# Patient Record
Sex: Female | Born: 1935 | Race: White | Hispanic: No | Marital: Single | Smoking: Never smoker
Health system: Southern US, Community
[De-identification: ages and names within clinical notes are randomized; demographics above are authoritative.]

## PROBLEM LIST (undated history)

## (undated) DIAGNOSIS — Z950 Presence of cardiac pacemaker: Secondary | ICD-10-CM

## (undated) DIAGNOSIS — N390 Urinary tract infection, site not specified: Secondary | ICD-10-CM

## (undated) DIAGNOSIS — M549 Dorsalgia, unspecified: Secondary | ICD-10-CM

## (undated) DIAGNOSIS — I499 Cardiac arrhythmia, unspecified: Secondary | ICD-10-CM

## (undated) DIAGNOSIS — M810 Age-related osteoporosis without current pathological fracture: Secondary | ICD-10-CM

## (undated) DIAGNOSIS — Z7901 Long term (current) use of anticoagulants: Secondary | ICD-10-CM

## (undated) DIAGNOSIS — R0602 Shortness of breath: Secondary | ICD-10-CM

## (undated) DIAGNOSIS — H9190 Unspecified hearing loss, unspecified ear: Secondary | ICD-10-CM

## (undated) DIAGNOSIS — K219 Gastro-esophageal reflux disease without esophagitis: Secondary | ICD-10-CM

## (undated) DIAGNOSIS — M1990 Unspecified osteoarthritis, unspecified site: Secondary | ICD-10-CM

## (undated) DIAGNOSIS — G8929 Other chronic pain: Secondary | ICD-10-CM

## (undated) DIAGNOSIS — J811 Chronic pulmonary edema: Secondary | ICD-10-CM

## (undated) DIAGNOSIS — E669 Obesity, unspecified: Secondary | ICD-10-CM

## (undated) DIAGNOSIS — I442 Atrioventricular block, complete: Secondary | ICD-10-CM

## (undated) DIAGNOSIS — R339 Retention of urine, unspecified: Secondary | ICD-10-CM

## (undated) DIAGNOSIS — R55 Syncope and collapse: Secondary | ICD-10-CM

## (undated) DIAGNOSIS — R001 Bradycardia, unspecified: Secondary | ICD-10-CM

## (undated) DIAGNOSIS — F329 Major depressive disorder, single episode, unspecified: Secondary | ICD-10-CM

## (undated) DIAGNOSIS — I071 Rheumatic tricuspid insufficiency: Secondary | ICD-10-CM

## (undated) DIAGNOSIS — R42 Dizziness and giddiness: Secondary | ICD-10-CM

## (undated) DIAGNOSIS — D509 Iron deficiency anemia, unspecified: Secondary | ICD-10-CM

## (undated) DIAGNOSIS — F419 Anxiety disorder, unspecified: Secondary | ICD-10-CM

## (undated) DIAGNOSIS — R1312 Dysphagia, oropharyngeal phase: Secondary | ICD-10-CM

## (undated) DIAGNOSIS — E538 Deficiency of other specified B group vitamins: Secondary | ICD-10-CM

## (undated) DIAGNOSIS — E876 Hypokalemia: Secondary | ICD-10-CM

## (undated) DIAGNOSIS — A419 Sepsis, unspecified organism: Secondary | ICD-10-CM

## (undated) DIAGNOSIS — L89159 Pressure ulcer of sacral region, unspecified stage: Secondary | ICD-10-CM

## (undated) DIAGNOSIS — I4892 Unspecified atrial flutter: Secondary | ICD-10-CM

## (undated) DIAGNOSIS — E871 Hypo-osmolality and hyponatremia: Secondary | ICD-10-CM

## (undated) DIAGNOSIS — F0390 Unspecified dementia without behavioral disturbance: Secondary | ICD-10-CM

## (undated) DIAGNOSIS — Z789 Other specified health status: Secondary | ICD-10-CM

## (undated) DIAGNOSIS — I10 Essential (primary) hypertension: Secondary | ICD-10-CM

## (undated) DIAGNOSIS — R488 Other symbolic dysfunctions: Secondary | ICD-10-CM

## (undated) DIAGNOSIS — F32A Depression, unspecified: Secondary | ICD-10-CM

## (undated) HISTORY — DX: Iron deficiency anemia, unspecified: D50.9

## (undated) HISTORY — DX: Long term (current) use of anticoagulants: Z79.01

## (undated) HISTORY — DX: Major depressive disorder, single episode, unspecified: F32.9

## (undated) HISTORY — DX: Retention of urine, unspecified: R33.9

## (undated) HISTORY — DX: Rheumatic tricuspid insufficiency: I07.1

## (undated) HISTORY — DX: Shortness of breath: R06.02

## (undated) HISTORY — DX: Atrioventricular block, complete: I44.2

## (undated) HISTORY — DX: Hypo-osmolality and hyponatremia: E87.1

## (undated) HISTORY — DX: Other specified health status: Z78.9

## (undated) HISTORY — DX: Age-related osteoporosis without current pathological fracture: M81.0

## (undated) HISTORY — PX: CHOLECYSTECTOMY: SHX55

## (undated) HISTORY — DX: Dorsalgia, unspecified: M54.9

## (undated) HISTORY — DX: Presence of cardiac pacemaker: Z95.0

## (undated) HISTORY — DX: Unspecified atrial flutter: I48.92

## (undated) HISTORY — DX: Pressure ulcer of sacral region, unspecified stage: L89.159

## (undated) HISTORY — PX: KNEE ARTHROSCOPY: SUR90

## (undated) HISTORY — DX: Dysphagia, oropharyngeal phase: R13.12

## (undated) HISTORY — DX: Hypokalemia: E87.6

## (undated) HISTORY — DX: Chronic pulmonary edema: J81.1

## (undated) HISTORY — DX: Sepsis, unspecified organism: A41.9

## (undated) HISTORY — PX: APPENDECTOMY: SHX54

## (undated) HISTORY — DX: Urinary tract infection, site not specified: N39.0

## (undated) HISTORY — DX: Other chronic pain: G89.29

## (undated) HISTORY — DX: Deficiency of other specified B group vitamins: E53.8

## (undated) HISTORY — DX: Hypercalcemia: E83.52

## (undated) HISTORY — DX: Other symbolic dysfunctions: R48.8

## (undated) HISTORY — DX: Gastro-esophageal reflux disease without esophagitis: K21.9

## (undated) HISTORY — DX: Unspecified hearing loss, unspecified ear: H91.90

---

## 2002-02-11 ENCOUNTER — Encounter: Payer: Self-pay | Admitting: Emergency Medicine

## 2002-02-11 ENCOUNTER — Emergency Department (HOSPITAL_COMMUNITY): Admission: EM | Admit: 2002-02-11 | Discharge: 2002-02-11 | Payer: Self-pay | Admitting: Emergency Medicine

## 2002-11-24 ENCOUNTER — Encounter: Payer: Self-pay | Admitting: Emergency Medicine

## 2002-11-24 ENCOUNTER — Emergency Department (HOSPITAL_COMMUNITY): Admission: EM | Admit: 2002-11-24 | Discharge: 2002-11-24 | Payer: Self-pay | Admitting: Emergency Medicine

## 2002-11-29 ENCOUNTER — Encounter: Payer: Self-pay | Admitting: Emergency Medicine

## 2002-11-29 ENCOUNTER — Inpatient Hospital Stay (HOSPITAL_COMMUNITY): Admission: EM | Admit: 2002-11-29 | Discharge: 2002-11-30 | Payer: Self-pay | Admitting: Emergency Medicine

## 2002-11-30 ENCOUNTER — Encounter (INDEPENDENT_AMBULATORY_CARE_PROVIDER_SITE_OTHER): Payer: Self-pay | Admitting: Cardiology

## 2003-08-12 ENCOUNTER — Emergency Department (HOSPITAL_COMMUNITY): Admission: EM | Admit: 2003-08-12 | Discharge: 2003-08-12 | Payer: Self-pay | Admitting: Emergency Medicine

## 2003-08-12 ENCOUNTER — Encounter: Payer: Self-pay | Admitting: Emergency Medicine

## 2003-08-23 ENCOUNTER — Encounter: Payer: Self-pay | Admitting: Emergency Medicine

## 2003-08-23 ENCOUNTER — Inpatient Hospital Stay (HOSPITAL_COMMUNITY): Admission: EM | Admit: 2003-08-23 | Discharge: 2003-08-24 | Payer: Self-pay | Admitting: Emergency Medicine

## 2003-08-29 ENCOUNTER — Encounter (INDEPENDENT_AMBULATORY_CARE_PROVIDER_SITE_OTHER): Payer: Self-pay | Admitting: Specialist

## 2003-08-29 ENCOUNTER — Inpatient Hospital Stay (HOSPITAL_COMMUNITY): Admission: RE | Admit: 2003-08-29 | Discharge: 2003-09-01 | Payer: Self-pay | Admitting: General Surgery

## 2003-08-29 ENCOUNTER — Encounter: Payer: Self-pay | Admitting: General Surgery

## 2003-08-30 ENCOUNTER — Encounter: Payer: Self-pay | Admitting: Gastroenterology

## 2003-11-25 ENCOUNTER — Inpatient Hospital Stay (HOSPITAL_COMMUNITY): Admission: EM | Admit: 2003-11-25 | Discharge: 2003-12-01 | Payer: Self-pay | Admitting: Emergency Medicine

## 2003-11-28 ENCOUNTER — Encounter (INDEPENDENT_AMBULATORY_CARE_PROVIDER_SITE_OTHER): Payer: Self-pay | Admitting: Specialist

## 2004-01-01 ENCOUNTER — Encounter: Admission: RE | Admit: 2004-01-01 | Discharge: 2004-03-05 | Payer: Self-pay | Admitting: Orthopedic Surgery

## 2004-05-02 ENCOUNTER — Inpatient Hospital Stay (HOSPITAL_COMMUNITY): Admission: EM | Admit: 2004-05-02 | Discharge: 2004-05-05 | Payer: Self-pay | Admitting: Emergency Medicine

## 2004-05-04 ENCOUNTER — Encounter (INDEPENDENT_AMBULATORY_CARE_PROVIDER_SITE_OTHER): Payer: Self-pay | Admitting: Cardiology

## 2008-01-21 ENCOUNTER — Inpatient Hospital Stay (HOSPITAL_COMMUNITY): Admission: EM | Admit: 2008-01-21 | Discharge: 2008-01-26 | Payer: Self-pay | Admitting: Emergency Medicine

## 2011-03-30 NOTE — H&P (Signed)
NAMEAVE, SCHARNHORST NO.:  [id number]   MEDICAL RECORD NO.:  [id number]          PATIENT TYPE:  EMS   LOCATION:  MAJO                         FACILITY:  [facility]   PHYSICIAN:  Lonia Blood, M.D.       DATE OF BIRTH:  January 05, 1928   DATE OF ADMISSION:  01/21/2008  DATE OF DISCHARGE:                              HISTORY & PHYSICAL   PRIMARY CARE PHYSICIAN:  Primary Care, Dr. Gabriel Earing.   CHIEF COMPLAINT:  Epigastric pain.   HISTORY OF PRESENT ILLNESS:  Mrs. Ungerer is a 77 year old woman who says  that for the past couple weeks she has been having intermittent  interscapular pain.  She also says that she thinks the pain is radiating  to her lower chest and maybe epigastric area.  The patient says that the  pain is constant, severe, about 4/10 and she got more concerned when she  was developing also shortness of breath.  The patient has had a cardiac  catheterization back in 2004 when she was found not to have any  significant coronary disease.   PAST MEDICAL HISTORY:  Hypertension, cholecystectomy, chronic anxiety,  osteoarthritis, gastroesophageal reflux disease.   HOME MEDICATIONS:  Diovan, Xanax, Maxzide, nifedipine and Nexium.  Ms.  Blakeney says that she actually does not take the Nexium because it causes  her to have diarrhea.   SOCIAL HISTORY:  The patient is married, lives with her husband.  She  does not smoke cigarettes, does not drink alcohol.   FAMILY HISTORY:  The patient's father died of cardiac disease.   ALLERGIES:  Aspirin.   REVIEW OF SYSTEMS:  Negative for nausea or vomiting.  Negative for  diarrhea.  Negative for coughing.  Negative for headaches.  Negative  focal weakness or numbness.  All other systems have been reviewed and  are negative.   PHYSICAL EXAMINATION:  VITAL SIGNS:  Temperature of 97.2, blood pressure  133/75, pulse 59, respiration 18, saturation 99% on room air.  GENERAL APPEARANCE:  Mildly obese woman in no acute distress,  sitting on  stretcher.  Alert and oriented to place, person and time.  Head  normocephalic, atraumatic.  Eyes: Pupils equal and round and react to  light and accommodation.  Patient wearing glasses.  Conjunctivae are  pink.  Sclera anicteric.  Throat clear.  NECK:  Supple.  No JVD.  CHEST: Clear to auscultation without wheeze, rhonchi or crackles.  HEART:  Regular rate and rhythm without murmurs, rubs or gallops.  ABDOMEN:  Obese and soft, nontender.  Bowel sounds are present.  LOWER EXTREMITIES: Without edema.  SKIN:  Warm, dry without any suspicious rashes.   LABORATORY VALUES ON ADMISSION:  Sodium is 119, potassium 3.5, chloride  85, BUN 14, glucose 107, creatinine is 1.6, myoglobin 143, CK-MB less  than one.  Troponin I less than 0.05, D-dimer is less than 0.22.  Chest  x-ray shows hiatal hernia.  No acute disease.  EKG reviewed by myself  shows a first-degree AV block and nonsignificant ST-T changes.   ASSESSMENT/PLAN:  1. A 77-year woman with lower chest  and epigastric pain persisted for      about a couple weeks.  My plan is to admit the patient to the      hospital to rule out an inferior myocardial infarction.  I am also      considering the possibility of gastritis, esophagitis, peptic ulcer      disease.  Ms. Cacioppo will be started on intravenous proton pump      inhibitor and a lipase will be checked too, to rule out      pancreatitis.  Three sets of cardiac enzymes will be checked every      8 hours.  The patient will be kept on telemetry.  2. Hyponatremia.  I do suspect this might be secondary      hydrochlorothiazide.  Serum osmolality, urine osmolality, TSH and      cortisol will be sent as well.  Intravenous fluids and      discontinuation of hydrochlorothiazide will be the way to go for      now.  Close monitoring of the patients BMP wil be done.      Lonia Blood, M.D.  Electronically Signed     SL/MEDQ  D:  01/21/2008  T:  01/22/2008  Job:  [id number]

## 2011-03-30 NOTE — Consult Note (Signed)
NAMESHALIN, LINDERS NO.:  [id number]   MEDICAL RECORD NO.:  [id number]          PATIENT TYPE:  INP   LOCATION:  4715                         FACILITY:  [facility]   PHYSICIAN:  John C. Madilyn Fireman, M.D.    DATE OF BIRTH:  Oct 19, 1928   DATE OF CONSULTATION:  01/24/2008  DATE OF DISCHARGE:                                 CONSULTATION   REQUESTING PHYSICIAN:  Wilson Singer, M.D.   We were asked to see Ms. Felmlee today in consultation by Dr. Karilyn Cota of  [facility].   HISTORY OF PRESENT ILLNESS:  This is a very pleasant 77 year old female  who has been a patient of Dr. Molly Maduro Buccini with a history of  cholecystectomy and questionable choledocholithiasis.  She reports:   1. Increasing dyspnea on exertion at the beach last week.  2. Development of severe back pain under her rib cage bilaterally      while in the car on the way home on the beach on Saturday or      Sunday.   The patient states this pain felt exactly like the pain she had when she  had an impacted gallbladder.  The patient denies any anorexia, fever,  emesis or change in bowel habits.  She reports that she occasionally has  heartburn.  Her primary care physician is Dr. Gabriel Earing.  The  patient also tells me she has never had a colonoscopy.   PAST MEDICAL HISTORY:  1. Hypertension.  2. Anxiety.  3. Osteoarthritis.  4. GERD.  5. She does have a small hiatal hernia.   SURGERIES:  1. A cholecystectomy on August 29, 2003 and an ERCP done by Dr.      Bernette Redbird on August 30, 2003, that was negative for common      bile duct stones.  2. She has had as a left knee arthroscopic a November 28, 2003.   CURRENT MEDICATIONS:  Diovan, Xanax, Maxzide and nifedipine.  She also  takes over-the-counter Zantac as PPIs give her diarrhea.   She has an allergy to ASPIRIN.   REVIEW OF SYSTEMS:  Per HPI.   SOCIAL HISTORY:  Negative for alcohol and tobacco.   FAMILY HISTORY:  Negative for pancreatic,  liver or colon cancer.  She  does have a niece who has had gallbladder disease.   PHYSICAL EXAM:  She is alert and oriented in no apparent distress, very  pleasant to speak with.  She is standing up brushing her dentures.  Temperature is 97.6, pulse 58, respirations are 20, blood pressure is  100/68.  CARDIOVASCULAR:  A regular rate and rhythm with slight systolic murmur.  LUNGS:  Clear to auscultation.  ABDOMEN:  Soft, nontender, nondistended, with good bowel sounds.   LABS:  Her BMET is significant for a sodium of 130, potassium 3.4, BUN  5, creatinine 0.88.  LFTs are normal.  Lipase is normal.  CBC is normal.  On March 10 an abdominal ultrasound was done that showed her CBD was  dilated at 12 mm with no common bile duct stones seen.  Her  CT of her  abdomen and pelvis is pending.   ASSESSMENT:  Dr. Dorena Cookey has seen and examined the patient, collected  a history, and his impression is that this is a very pleasant 79-year-  old female experiencing sharp back pain of uncertain etiology.  She also  does have a dilated common bile duct on ultrasound.  Given her normal  liver function tests and CBC and normal exam today, doubt any immediate  endoscopic procedures or endoscopic retrograde cholangiopancreatography  is necessary.  Will await CT results and follow with you.   Thank you very much for this consultation.      Stephani Police, PA    ______________________________  Everardo All Madilyn Fireman, M.D.    MLY/MEDQ  D:  01/24/2008  T:  01/25/2008  Job:  [id number]   cc:   Bernette Redbird, M.D.  Gabriel Earing, M.D.

## 2011-04-02 NOTE — Consult Note (Signed)
NAME:  Tonya Maddox, Tonya Maddox                           ACCOUNT NO.:  [id number]   MEDICAL RECORD NO.:  [id number]                   PATIENT TYPE:  INP   LOCATION:  0371                                 FACILITY:  [facility]   PHYSICIAN:  Meade Maw, M.D.                 DATE OF BIRTH:  1928/01/12   DATE OF CONSULTATION:  DATE OF DISCHARGE:                                   CONSULTATION   INDICATION FOR CONSULTATION:  Syncope.   Tonya Maddox is a very pleasant 77 year old female who was in her usual state  of health on May 02, 2004. She found herself on the dining room floor and  was unsure as to how she had gotten there. The last that she remembers was  that was in the kitchen, does remember going to the dining room, woke with  total unconsciousness. The event was unwitnessed. There was no trauma. Ms.  Maddox notes that she has had an approximately two week history of  palpitations and heart fluttering. Her Tenormin has recently been  discontinued secondary to bradyarrhythmias. She has had some mild shortness  of breath with exertion. She has had no prior syncope against. She has  underwent a cardiac evaluation by Dr. Viann Fish in January 2004. This  revealed normal coronaries. Coronary angiography was performed for further  evaluation of dyspnea.  Her ejection fraction was noted to be 70%.   PAST MEDICAL HISTORY:  1. Cholecystitis, status post laparoscopic cholecystectomy in October 2004.  2. Hypertension.  3. GERD.  4. Xanax.  5. Degenerative joint disease.   PAST SURGICAL HISTORY:  Significant for cholecystectomy as noted above.   MEDICATIONS:  As an outpatient included Maxzide 37.5/25, Procardia 30 mg  daily, Diovan 160 mg daily, Xanax 1 mg daily, Nexium daily.   CURRENT MEDICATIONS:  Protonix instead of Nexium, Avapro instead of Diovan,  continues on her Maxzide, Lovenox 40 mg subcu has been started daily, and  aspirin 325 mg daily has been added.   ALLERGIES:  She has no  known drug allergies.   SOCIAL HISTORY:  The patient lives alone. Long-term use of tobacco use.  No  alcohol use. She has never married.  Her brother lives in an upstairs  apartment.   FAMILY HISTORY:  Father had cardiac disease, but she is unsure of the exact  etiology.   PHYSICAL EXAMINATION:  GENERAL: On physical exam reveals an elderly female  in no acute distress. Orlene Erm is appropriate.  VITAL SIGNS: She is afebrile. Heart rate in the 60s to 70s, systolic blood  pressure 110/46, O2 saturation 97% on room air.  HEENT: Unremarkable. Good carotid upstrokes. No carotid bruits. Thyroid is  not palpable.  PULMONARY:  Breath sounds are equal and clear to auscultation. No crackles  noted.  CARDIOVASCULAR: Normal S1 and S2. Regular rate and rhythm with no murmurs,  rubs, or gallops noted. PMI is not palpable.  ABDOMEN: Soft and nontender.  EXTREMITIES: No edema.  SKIN: Warm and dry.  NEUROLOGIC: Nonfocal.   LABORATORY DATA:  White count 6.1, hematocrit 42, platelet count 201,000.  Sodium 136, potassium 4.9, chloride 99, glucose 107, BUN 16, creatinine 1.3.  Serial cardiac enzymes are normal. BNP was 46.  TSH has not been ordered.   Chest x-ray reveals no acute disease. ECG reveals a sinus rhythm, first  degree block, otherwise normal ECG. Telemetry reviewed reveals a sinus  rhythm with a rate in the 60s to 70s, lowest rate in the 40s during sleep.   IMPRESSION:  1. Syncope in a 77 year old female. Of note, the patient had no trauma at     the time of the syncopal event. There was no urinary or fecal     incontinence to suggest seizures. She awoke with complete recovery of her     mental status. This would suggest a cardiac etiology. Coronary     angiography as  noted above was normal. A 2-D echo has been ordered and     would obtain a TSH if this has not recently been performed. Will     discontinue her oxygen. Encourage the patient to ambulate in the hallway.     She may  eventually need a loop recorder to assist in the diagnosis. At     this point would decrease her aspirin to 81 mg daily while on Lovenox.  2. Hypertension. Blood pressure is currently well controlled.  3. Anxiety. She continues on her Xanax.  4. Gastroesophageal reflux. The patient is currently on Protonix.                                               Meade Maw, M.D.    HP/MEDQ  D:  05/03/2004  T:  05/03/2004  Job:  [id number]   cc:   Gabriel Earing, M.D.  [address]  [place]  [place] [address]  Fax: [phone]   W. Ashley Royalty., M.D.  [address]  [place]  [place] [address]  Fax: [phone]

## 2011-04-02 NOTE — Op Note (Signed)
NAME:  Tonya Maddox, Tonya Maddox                           ACCOUNT NO.:  [id number]   MEDICAL RECORD NO.:  [id number]                   PATIENT TYPE:  INP   LOCATION:  0449                                 FACILITY:  [facility]   PHYSICIAN:  Marlowe Kays, M.D.               DATE OF BIRTH:  Apr 12, 1928   DATE OF PROCEDURE:  11/28/2003  DATE OF DISCHARGE:                                 OPERATIVE REPORT   PREOPERATIVE DIAGNOSIS:  Osteochondral fragments with mechanical  derangement, left knee.   POSTOPERATIVE DIAGNOSIS:  Osteochondral fragments with mechanical  derangement, left knee.   OPERATION:  Left knee arthroscopy with:  1. Partial lateral meniscectomy.  2. Debridement of medial femoral condyle, lateral femoral condyle, and     patella.  3. Arthrotomy with removal of two large osteochondral fragments.   SURGEON:  Marlowe Kays, M.D.   ASSISTANT:  Nurse.   ANESTHESIA:  General.   PATHOLOGY AND JUSTIFICATION FOR PROCEDURE:  Several years ago she had an  episode where her knee locked up on her, and she had another episode several  days ago.  She was admitted to [facility], where x-ray demonstrated at  least one large osteochondral fragment in the suprapatellar area.  The  situation was thoroughly discussed with her.  She lives alone and was  concerned about possibly falling and fracturing her hip and elected to have  operative intervention at this time.  See operative description below for  additional details.   DESCRIPTION OF PROCEDURE:  Satisfactory general anesthesia, pneumatic  tourniquet.  The knee was esmarched out nonsterilely.  A thigh stabilizer.  The left leg was prepped with Duraprep, draped in a sterile field.  Superior  medial saline inflow first through an anterolateral portal.  The medial  compartment of the knee joint was evaluated.  She had a good bit of reactive  tissue present, including a worn medial femoral condyle, all of which I  debrided out.  Her medial  meniscus was frayed but not torn.  Looking up the  medial gutter and suprapatellar area she had a good bit of wear of the  patella, which I shaved, and a large amount of synovitis in the superior  joint, which I debrided out with a 4.2 shaver.  I did find several  osteophytes but did not find the loose fragments through this portal.  I  then reversed portal laterally.  She had a badly torn lateral meniscus,  which I shaved down until smooth, and it had one significant divot out of  the lateral femoral condyle, which I debrided down.  I then looked up in the  lateral gutter and suprapatellar area and found an osteophyte off the  lateral femoral condyle but one large osteochondral fragment that was, I  felt, too large to try and get out arthroscopically.  Accordingly I  terminated the arthroscopic portion of the procedure at this point.  I made  a superior lateral parapatellar incision of a few inches long and on  entering the joint found the one fragment but also found another fragment of  almost similar size, with one measuring 2.5 x 1.5 cm and the other 2.5 x 1.0  cm.  These were removed.  I then brought in the C-arm and found no  additional fragments on the C-arm projections and also with digital  palpation of the joint could not appreciate any more.  Accordingly, I felt  that we had accomplished our goal.  The three portals were closed with  interrupted 4-0 nylon.  The lateral retinaculum was closed with interrupted  0 Vicryl, as was the deep subcutaneous tissue and superficial subcutaneous  tissue.  Nylon 4-0 was placed in the skin.  I injected the knee joint with  20 mL of  0.5% Marcaine with adrenalin.  Betadine and Adaptic dry sterile dressing  were applied to her leg.  The tourniquet was released with a little over an  hour and a half of tourniquet time.  She tolerated the procedure well and  was taken to the recovery room in satisfactory condition with no known   complications.                                               Marlowe Kays, M.D.    JA/MEDQ  D:  11/28/2003  T:  11/28/2003  Job:  [id number]

## 2011-04-02 NOTE — Op Note (Signed)
NAME:  Tonya Maddox, Tonya Maddox                           ACCOUNT NO.:  [id number]   MEDICAL RECORD NO.:  [id number]                   PATIENT TYPE:  INP   LOCATION:  0349                                 FACILITY:  [facility]   PHYSICIAN:  Ollen Gross. Vernell Morgans, M.D.              DATE OF BIRTH:  October 22, 1928   DATE OF PROCEDURE:  08/29/2003  DATE OF DISCHARGE:  09/01/2003                                 OPERATIVE REPORT   PREOPERATIVE DIAGNOSIS:  Gallstones.   POSTOPERATIVE DIAGNOSIS:  Cholecystitis with cholelithiasis, and possible  retained common duct stone.   OPERATION/PROCEDURE:  Laparoscopic cholecystectomy with intraoperative  cholangiogram.   SURGEON:  Ollen Gross. Carolynne Edouard, M.D.   ASSISTANT:  Currie Paris, M.D.   ANESTHESIA:  General endotracheal anesthesia.   DESCRIPTION OF PROCEDURE:  After informed consent was obtained, the patient  was brought to the operating room and placed in the supine position on the  operating room table.  After adequate induction of general anesthesia, the  patient's abdomen was prepped with Betadine and draped in the usual sterile  manner.  The area below the umbilicus was infiltrated with 0.25% Marcaine.  A small incision was made with the 15-blade knife.  This incision was  carried down through the subcutaneous tissue bluntly with the Kelly clamp  and [employer] retractors until the linea alba was identified.  The linea  alba was incised with the 15-blade knife and each side was grasped with  Kocher clamps and elevated anteriorly.  The preperitoneal space was then  probed bluntly with a hemostat until the peritoneum was opened and access  was gained to the abdominal cavity.  A 0-Vicryl pursestring stitch was  placed in the fascia surrounding the opening. The Hasson cannula was placed  through the opening and anchored in place with the previously placed Vicryl  pursestring stitch.  The abdomen was then insufflated with carbon dioxide  without difficulty.  The  patient was placed in the head-up position and the  laparoscope was placed through the Hasson cannula and the right upper  quadrant was inspected.  The stoma of the gallbladder and liver were readily  identified.  The epigastric region was then infiltrated with 0.25% Marcaine.  A small incision was made with the 15-blade knife and a 10 mm port was  placed bluntly through this incision into the abdominal cavity under direct  vision.  Sites were then chosen laterally on the right side of the abdomen  with placement of a 5 mm ports.  Each of these areas was infiltrated with  0.25% Marcaine.  Small stab incisions were made with 15-blade knife and a 5  mm port was placed bluntly through these incisions into the abdominal cavity  under direct vision.  A blunt grasper was placed through the lateral-most 5  mm port and used to grasp the dome of the gallbladder and elevate it  anteriorly and  superiorly.  The gallbladder was rather tense and required  percutaneous aspiration.  Once this was done, it appeared the gallbladder  had some purulence in it.  The gallbladder was then elevated anteriorly and  superiorly. Another blunt grasper was placed in the other 5 mm port and used  to retract on the body and neck of the gallbladder.  A dissector was placed  through the epigastric port and using the electrocautery, the peritoneal  reflection at the gallbladder neck was opened.  Very careful blunt  dissection was then carried out in this area until the gallbladder  neck/cystic duct junction was readily identified.  The cystic duct appeared  to be very short and a lot of care of taken during this portion of the  dissection.  Once the good window had been created, a single clip was placed  on the gallbladder neck.  A small ductotomy was made.  A 14-gauge angiocath  was placed percutaneously through the anterior abdominal wall.  A Reddick  cholangiogram catheter was placed through the angiocath and flushed.   The  Reddick catheter was then placed within the cystic duct and anchored in  place with a clip.  A cholangiogram was obtained that suggested a small  filling defect consistent with a common bile duct stone that was freely  floating and mobile and nonobstructing.  The rest of the ducts were dilated  but emptied readily into the duodenum and the cystic duct was very short.  The anchoring clip and catheters were then removed from the patient.  Because of the shortness of the cystic duct and its delicacy, two clips were  placed proximally on the cystic duct and the duct was divided between the  two sets of clips.  Posterior to this the cystic artery are identified and  again dissected in a circumferential manner bluntly until a good window was  created.  Two clips were placed proximally and one distally on the artery  and the artery was divided between the two.  Next, the laparoscopic hook  cautery device was used to separate the gallbladder from the liver bed.  Prior to completely detaching the gallbladder from the liver bed, the liver  bed was inspected and several small bleeding points were coagulated with the  electrocautery.  The gallbladder was then detached the rest of the way from  the liver bed with the hook cautery device.  An endoscopic device was then  placed through the epigastric port and the gallbladder was then placed  through the epigastric and the gallbladder was placed within the bag and the  bag was sealed.  The abdomen was then irrigated with copious amounts of  saline until the effluent was clear.  The laparoscope was then moved to the  epigastric port and a gallbladder grasper was placed through the Hasson  cannula and used to grasp the opening of the bag.  The bag with the  gallbladder was then removed through the infraumbilical port without  difficulty.  The fascial defect was closed with the previously placed 0 Vicryl pursestring stitch as well as with another  interrupted 0 Vicryl  stitch.  The rest of the ports were removed under direct vision and were all  found to be hemostatic.  The gas was allowed to escape.  The skin incisions  were all closed with interrupted 4-0 Monocryl subcuticular stitches.  Benzoin and Steri-Strips and sterile dressings were applied.  The patient  tolerated the procedure well.  At the end of  the case all needle, sponge and  instrument counts were correct.  The patient was awakened and taken to the  recovery room in stable condition.   Gastroenterology would be consulted for further intervention, possible ERCP  to remove what was felt to be a common bile duct stone.                                                  Ollen Gross. Vernell Morgans, M.D.    PST/MEDQ  D:  09/01/2003  T:  09/01/2003  Job:  [id number]

## 2011-04-02 NOTE — Discharge Summary (Signed)
NAME:  Tonya Maddox, Tonya Maddox                           ACCOUNT NO.:  [id number]   MEDICAL RECORD NO.:  [id number]                   PATIENT TYPE:  INP   LOCATION:  0371                                 FACILITY:  [facility]   PHYSICIAN:  Hollice Espy, M.D.            DATE OF BIRTH:  1928/10/26   DATE OF ADMISSION:  05/02/2004  DATE OF DISCHARGE:  05/05/2004                                 DISCHARGE SUMMARY   PRIMARY CARE PHYSICIAN:  Gabriel Earing, M.D.   CARDIOLOGIST:  Georga Hacking, M.D.   CONSULTS:  Dr. Meade Maw, cardiology.   CHIEF COMPLAINT:  Syncope.   HISTORY OF PRESENT ILLNESS:  This is a 77 year old white female with a  history of hypertension, GERD, anxiety, who presented to [facility] ER  after having a period of a syncopal episode on May 02, 2004.  She has had  an approximately two-week history of palpitations and heart fluttering.  The  patient had been on Tenormin but this had been recently discontinued  secondary to bradycardic arrhythmias.  The patient was admitted to the  hospital.  Cardiac enzymes were checked and were found to be negative.  The  patient was put on telemetry.  A 2D echo and a cardiac consult were  requested.  The patient otherwise was stable.   HOSPITAL COURSE:  In regard to the patient's syncope, the patient had no  further events during her hospital stay.  In fact, she was able to walk  around freely in the hallways without event.  A 2D echo was checked, and the  patient was found to have normal heart function.  A 2D echo was checked on  May 04, 2004.  It was found to have normal left ventricular systolic  function and actually a slightly vigorous ejection fraction of 65-75%.  No  left ventricular regional wall abnormalities were noted.  She had an  increased relative contribution of atrial contraction and left ventricular  filling.  Some mild aortic regurg and mild mitral annular calcification.  In  addition, cardiology, Dr. Fraser Din, saw  the patient and felt the patient  otherwise was stable.  Following a normal echo, they recommended discharge  and a possible event monitor as an outpatient.  As part of her workup, TSH  was checked and found to be low at 0.258.  A free T4 was checked and found  to be on the higher end of normal at 1.66.  At this point, it was felt that  the patient likely had some subclinical hyperthyroidism, probably masked by  her previous Tenormin.  I have restarted the patient on her Tenormin at a  very low dose of 6.25 mg p.o. daily in the morning, considering that her  symptoms mostly occur during the day.   Otherwise, the patient is doing well.  She is being discharged.  She will  follow up with Dr. Donnie Aho in two weeks  as an outpatient and at that time be  fitted for an event monitor.  The rest of her medical issues, including  blood pressure, GERD, anxiety, and DJD were all stable medical issues.   Patient is being discharged to home.  Her disposition is improved.  Her diet  will be a low sodium diet.  Activity will be as tolerated.  She will follow up with her PCP, Dr.  Andi Devon, in the next 1-2 weeks.  She will continue all of her previous  medications.                                               Hollice Espy, M.D.    SKK/MEDQ  D:  05/05/2004  T:  05/06/2004  Job:  [id number]   cc:   Meade Maw, M.D.  [address]  [place]  [place] [address]  Fax: [phone]   Gabriel Earing, M.D.  [address]  [place]  [place] [address]  Fax: [phone]   W. Ashley Royalty., M.D.  [address]  [place]  [place] [address]  Fax: [phone]

## 2011-04-02 NOTE — H&P (Signed)
Tonya Maddox, MCKERCHER NO.:  [id number]   MEDICAL RECORD NO.:  [id number]                   PATIENT TYPE:  INP   LOCATION:  6529                                 FACILITY:  [facility]   PHYSICIAN:  W. Ashley Royalty., M.D.         DATE OF BIRTH:  14-Apr-1928   DATE OF ADMISSION:  11/29/2002  DATE OF DISCHARGE:                                HISTORY & PHYSICAL   REASON FOR ADMISSION:  This 77 year old female is admitted to rule out a  myocardial infarction and evaluate for possible unstable angina.   HISTORY OF PRESENT ILLNESS:  The patient has a longstanding history of  essential hypertension.  She has been taken care of by Dr. Andi Devon and was  seen in the office last week with increasing dyspnea and bradycardia.  She  was scheduled to have an echocardiogram.  Approximately five days ago she  developed worsening shortness of breath and chest discomfort and was seen at  the [facility] Room and was sent home.  She returned last evening with  increasing shortness of breath accompanied with chest pain described as a  sharp midsternal chest pain with some pressure characteristics lasting  around one minute.  The pain occurred and then resolved but recurred last  evening and she was brought by ambulance to the emergency room again and  admitted to the rule out unit.  She is currently not having any chest pain  at the present time.  She has had progressive dyspnea on exertion over the  past month also.   PAST MEDICAL HISTORY:  Remarkable for hypertension which is longstanding.  She has history of morbid obesity and no history of diabetes.   PAST SURGICAL HISTORY:  Appendectomy.   ALLERGIES:  None.   CURRENT MEDICATIONS:  Atenolol 100 mg daily, Maxzide daily, Procardia 90 mg  daily (recently changed from Norvasc), meclizine 25 mg as needed, potassium,  alprazolam for anxiety.   FAMILY HISTORY:  She had a sister die of rheumatic heart disease and  atrial  fibrillation.  There is no premature family history of heart disease.   SOCIAL HISTORY:  She has never been married.  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  She has never smoked or used alcohol  to excess. She currently lives alone.   REVIEW OF SYSTEMS:  She has had some intermittent dizziness described as  vertigo for which she has taken meclizine.  She has also had bradycardia  evaluated recently.  She wears glasses.  She has no other eye, ear, nose or  throat problems.  She has had progressive dyspnea on exertion.  She denies  PND or orthopnea.  She does not have exertional chest pain but gets little  in the way of exercise.  GI:  She denies diarrhea, constipation,  hematochezia, or melena.  GU:  Unremarkable.  She has moderate arthritis  involving her hands and  her knees.  She has some mild peripheral edema and  also  has some chronic venous insufficiency in the past.  No history of  stroke.  Other than noted above, the remainder of the review of systems  unremarkable.   PHYSICAL EXAMINATION:  GENERAL:  She is a pleasant, elderly woman who is  obese and in no acute distress.  VITAL SIGNS:  Blood pressure currently 160/80, pulse 50.  SKIN:  Warm and dry with some changes of chronic venous insufficiency over  the lower extremities.  HEENT:  EOMI.  PERRLA.  Sclerae clear.  Fundi not examined.  Pharynx  negative.  NECK:  Supple without masses.  No JVD, thyromegaly or bruits.  LUNGS:  Clear to A&P.  CARDIAC:  Normal S1 and S2.  No S3, S4 or murmur.  BREASTS:  Not examined.  ABDOMEN:  Obese, soft and nontender.  No aneurysm.  Pulmonary pulses are  deep and 1-2+.  Dorsalis pedis are 2+.  Posterior tibial pulses are  difficult to feel.  No edema noted.  EKG:  Sinus bradycardia with nonspecific ST abnormality.   LABORATORY DATA:  Hemoglobin 14.3, hematocrit 42.5.  Chemistry panel shows a  sodium of 138, potassium 3.7, chloride 103, CO2 31, BUN 20,  creatinine 1.2,  glucose 1.6.  Cardiac enzymes showed negative CPK and MB.   IMPRESSION:  1. Progressive dyspnea accompanied by chest pain which may be a typical     angina and unstable angina.  2. Hypertensive heart disease.  Blood pressure not well controlled.  3. Obesity.  4. History of chronic venous insufficiency.  5. Anxiety.  6. History of vertigo.   RECOMMENDATIONS:  The patient is not having any chest pain at this time but  in view of the pain at rest and worsening dyspnea, I have elected to proceed  directly to cardiac catheterization to exclude significant coronary artery  disease as the cause.  The procedure was discussed with her fully including  risk of myocardial infarction, death, stroke, bleeding, arrhythmia, dye  allergy, or renal insufficiency and she is willing to proceed.  Possible  angioplasty and stenting also discussed with her.                                               Darden Palmer., M.D.    WST/MEDQ  D:  11/29/2002  T:  11/29/2002  Job:  [id number]   cc:   Gabriel Earing, M.D.  [address]  [place]  [place] [address]  Fax: [phone]

## 2011-04-02 NOTE — H&P (Signed)
NAME:  Tonya Maddox, Tonya Maddox                           ACCOUNT NO.:  [id number]   MEDICAL RECORD NO.:  [id number]                   PATIENT TYPE:  INP   LOCATION:  0102                                 FACILITY:  [facility]   PHYSICIAN:  Blanch Media, M.D.             DATE OF BIRTH:  Aug 11, 1928   DATE OF ADMISSION:  05/02/2004  DATE OF DISCHARGE:                                HISTORY & PHYSICAL   [name] is a 77 year old white female who is followed by Dr. Harvest Dark at  [facility].  Patient was in her usual state of health, and this morning woke  up and placed her breakfast on the dining room table.  She returned to the  kitchen, but she cannot remember why.  She was feeling completely normal.  The next thing she remembers is coming to on the dining room floor.  Other  than having some palpitations and dyspnea after regaining consciousness, she  had no other symptoms and was feeling in her next several hours.  She does  relate that for the past two days, she has been having fluttering, which she  describes as an increased feeling of her heartbeat and having it feel fast.  These are intermittent for the past two days and occasionally occur with  shortness of breath.  They do not occur with any pain or any other symptoms.  She has had some palpitations in the past, but she says not this strong and  not this fast.  She has also been noticing some shortness of breath, mostly  dyspnea on exertion.  She has been evaluated for dyspnea on exertion in the  past by Dr. Donnie Aho without any cardiac cause.  The patient has had no  syncope in the past.  There is no sudden death unexplained in her family.  When asked, the patient states that she does have some pain with a deep  breath.   PAST MEDICAL HISTORY:  1. Dyspnea.     A. Evaluated by Dr. Donnie Aho with a cardiac catheterization in January,        2004 that showed no coronary artery disease and an ejection fraction        of 70%.     B. Patient has had  a history of bradycardia.  Her Tenormin was recently        DC'd.  2. Cholecystitis and cholelithiasis.     A. On August 30, 2003, status post laparoscopic cholecystectomy.     B. On August 30, 2003, ERCP was negative for common bile duct stone.  3. Hypertension.  4. GERD.  5. Anxiety.     A. Patient uses Xanax daily.  6. Degenerative joint disease.     A. Status post left knee arthroscopy on November 28, 2003.   MEDICATIONS:  1. Maxzide 37.5/25 q.d.  2. Procardia 30 q.d.  3. Diovan 160 q.d.  4. Xanax 1 mg q.d.  5. Nexium q.d.   ALLERGIES:  Patient has no known drug allergies.   SOCIAL HISTORY:  Patient lives alone.  She is a lifelong cigarette and  nondrinker.   FAMILY HISTORY:  Her father has heart disease, but she does not know the  particulars.  There is no personal or family history of clotting disorders.  There is no family history of sudden unexplained death.  A relative does  have congestive heart failure.   PHYSICAL EXAMINATION:  VITAL SIGNS:  GENERAL:  Patient is in no acute distress.  She contributes fully to the  history.  NECK:  No carotid bruits.  No lymphadenopathy.  LUNGS:  Clear to auscultation bilaterally with good air movement.  No  wheezes.  No crackles.  HEART:  Regular rate and rhythm with an S3.  No murmurs appreciated.  ABDOMEN:  Positive bowel sounds.  Soft, nontender, nondistended.  EXTREMITIES:  No edema.  Warm.  SKIN:  No rashes.  NEUROLOGIC:  Strength grossly 5/5 in the upper extremities.   LABS:  Sodium 135, potassium 4.8, chloride 98, CO2 27, glucose 113, BUN 16,  creatinine 1.3, LFTs normal.  White blood cell count 6.1, hemoglobin 14.6,  platelet count 201.   CHEST X-RAY:  Increased volume but no pulmonary edema.  No infiltrate.   EKG:  Sinus rhythm.  There is an inverted T wave in lead 3 but otherwise no  ischemic changes.   ASSESSMENT/PLAN:  1. Syncope:  Her history would indicate that this is a cardiac etiology for     syncope.   She has had some history of bradycardia, and her beta blocker     was held secondary to this.  She had a catheterization a year and a half     ago that showed no coronary artery disease and a normal ejection     fraction.  I think an acute myocardial infarction is highly unlikely, but     we will cycle cardiac enzymes.  I think an arrhythmia is the most likely     etiology.  We will monitor her on telemetry.  We will hold her Procardia     for now and watch her heart rate.  We will continue her Maxzide and her     ARB.  As Dr. Donnie Aho has seen her in the past, we will notify his group     that see him in the hospital, as she will likely need further     investigations for her most likely arrhythmia as the etiology of her     syncope.  We will check a 2D echocardiogram, as she has not had one since     2004.  We will also check a D-dimer, and if positive, will proceed to a     CT scan to rule out a pulmonary embolus.  2. Hypertension:  Will continue her Maxzide and her Diovan but hold her     Procardia.  Monitor her blood pressure while she is in the hospital.  3. Gastroesophageal reflux disease:  Patient uses Nexium at home but will     use Protonix here in the hospital.  4. Anxiety:  Will continue her Xanax q.d.  5. Degenerative joint disease:  Will use Tylenol for pain control.  6. Prophylaxis:  Will start Lovenox for deep venous thrombosis prophylaxis.  Blanch Media, M.D.    EB/MEDQ  D:  05/02/2004  T:  05/02/2004  Job:  [id number]

## 2011-04-02 NOTE — Discharge Summary (Signed)
NAMECARTHA, ROTERT                 ACCOUNT NO.:  [id number]   MEDICAL RECORD NO.:  [id number]          PATIENT TYPE:  INP   LOCATION:  3017                         FACILITY:  [facility]   PHYSICIAN:  Wilson Singer, M.D.DATE OF BIRTH:  1928/03/08   DATE OF ADMISSION:  01/21/2008  DATE OF DISCHARGE:  01/26/2008                               DISCHARGE SUMMARY   FINAL DISCHARGE DIAGNOSES:  1. Epigastric abdominal pain secondary to passed gallstone most      likely.  2. Hypertension.  3. Chronic anxiety.  4. Gastroesophageal reflux disease.   MEDICATIONS ON DISCHARGE:  1. Xanax 1 mg daily.  2. Norvasc 10 mg daily.  3. Protonix 40 mg daily   CONDITION ON DISCHARGE:  Stable.   HISTORY:  This 77 year old lady was admitted with epigastric abdominal  pain.  Please see initial history and physical examination done by Dr.  Lonia Blood.   HOSPITAL PROGRESS:  The patient was investigated for abdominal pain and  serial cardiac enzymes were negative.  She underwent ultrasound of the  abdomen which showed:  By palpate common bile duct dilatation at 12 mm.  She then underwent a CT scan of the abdomen at which point there were no  masses or stone seen and the common bile duct was not dilated so the  working diagnosis appears to be passage of a stone and resolution of her  symptoms.  By this time, her symptoms had improved and on the day of  discharge, she was afebrile and vital signs were stable.  I suspect she  passed a common bile duct stone.   FURTHER DISPOSITION:  She will follow up with her primary care  physician.      Wilson Singer, M.D.  Electronically Signed     NCG/MEDQ  D:  03/25/2008  T:  03/26/2008  Job:  [id number]

## 2011-04-02 NOTE — Discharge Summary (Signed)
NAME:  Tonya Maddox, Tonya Maddox                           ACCOUNT NO.:  [id number]   MEDICAL RECORD NO.:  [id number]                   PATIENT TYPE:  INP   LOCATION:  0349                                 [facility]ACILITY:  [facility]   PHYSICIAN:  Ollen Gross. Vernell Morgans, M.D.              DATE OF BIRTH:  05-12-1928   DATE OF ADMISSION:  08/29/2003  DATE OF DISCHARGE:  09/01/2003                                 DISCHARGE SUMMARY   HISTORY OF PRESENT ILLNESS:  [name] is a 77 year old white female who  was admitted with cholecystitis and cholelithiasis.   HOSPITAL COURSE:  She was taken to the operating room and underwent a  laparoscopic cholecystectomy with intraoperative cholangiogram.  The  intraoperative cholangiogram did show a retained common bile duct stone that  was unable to be retrieved during the laparoscopic portion of the procedure.  The procedure was then completed, and postoperatively gastroenterology was  consulted for possible ERCP. She tolerated the ERCP well, and over the  ensuing two days, her diet was advanced.  A JP drainage was left after the  laparoscopic procedure, which was then removed on September 01, 2003, and she  was ready for discharge home.   CONDITION AT THE TIME OF DISCHARGE:  Stable.   FINAL DIAGNOSIS:  Cholecystitis and cholelithiasis with retained common duct  stone.   ACTIVITY:  No heavy lifting.   DIET:  As tolerated.   FOLLOW UP:  With Dr. Carolynne Edouard in the next two weeks.   DISPOSITION:  She is discharged home.                                               Ollen Gross. Vernell Morgans, M.D.    PST/MEDQ  D:  10/16/2003  T:  10/16/2003  Job:  [id number]

## 2011-04-02 NOTE — H&P (Signed)
NAME:  Tonya Maddox, Tonya Maddox                           ACCOUNT NO.:  [id number]   MEDICAL RECORD NO.:  [id number]                   PATIENT TYPE:  INP   LOCATION:  0372                                 FACILITY:  [facility]   PHYSICIAN:  Francisca December, M.D.               DATE OF BIRTH:  Mar 16, 1928   DATE OF ADMISSION:  08/23/2003  DATE OF DISCHARGE:                                HISTORY & PHYSICAL   REASON FOR ADMISSION:  Chest pain.   HISTORY OF PRESENT ILLNESS:  [name] is a 77 year old woman who was  at the preop center having ECG and blood work done for planned  cholecystectomy next Thursday. This would be October 15. She was told her  ECG was abnormal and apparently felt to be changed from previous tracings  and a suggestion was made that she see Dr. Donnie Aho at her earliest  convenience. She went home and began to develop anterior substernal chest  pain that radiated across her chest but was more centered on the right side  and then radiated underneath the right scapula. It was associated with some  dyspnea and nausea. It persisted approximately an hour and she presented to  [facility] Emergency Room at around 18:50. The pain resolved spontaneously  shortly thereafter. She does not have a cardiac history but has had chest  pain in the past. In fact, Dr. Donnie Aho performed cardiac catheterization in  January of 2004 revealing no significant coronary disease. Her usual  gallbladder attacks have been right upper quadrant, right lower chest aching  pain that radiated to the scapula. She has been administered Vicodin in the  past for this with some relief.   This morning at the time of my evaluation, she has no discomfort in her  chest and generally feels well. She does note she has not had much p.o.  intake for the past two days due to her gallbladder problems.   PAST MEDICAL HISTORY:  1. Hypertension.  2. GERD.  3. Anxiety and depression.  4. Cholelithiasis.   PAST SURGICAL  HISTORY:  Appendectomy.   CURRENT MEDICATIONS:  1. Procardia XL 30 mg p.o. daily.  2. Atenolol 50 mg p.o. daily.  3. Nexium 40 mg p.o. daily.  4. Diovan 160 mg p.o. daily.  5. Maxzide 25/37.5 one p.o. daily.  6. Vicodin 1-2 p.o. q. 4-6h. p.r.n.   ALLERGIES:  None known.   FAMILY HISTORY:  Not significant for early coronary disease.   SOCIAL HISTORY:  There is no use of alcohol or tobacco. She lives alone with  family in the area.   REVIEW OF SYMPTOMS:  Noncontributory or negative.   PHYSICAL EXAMINATION:  VITAL SIGNS:  Blood pressure is 174/90, pulse 56 and  regular, respiratory rate 18, temperature 97.7. O2 saturation on room air  98%.  GENERAL:  This is a mildly obese elderly 77 year old woman who is alert,  pleasant, cooperative, no distress.  HEENT:  Unremarkable. Head is atraumatic and normocephalic. Pupils equal  round and reactive to light and accommodation. Extraocular movements intact.  Sclerae are anicteric. Oral mucosa is pink and moist. Tongue is not coated.  NECK:  The neck is supple without thyromegaly or masses. The carotid  upstrokes are normal. There is no bruit, there is no jugular venous  distention.  CHEST:  Clear with adequate excursion. Normal vesicular breath sounds are  heard throughout, the precordium is quiet. Normal S1 and S2 is heard. No S3,  S4, murmur, click or rub noted.  ABDOMEN:  Soft and slightly tender in the right upper quadrant. Bowel sounds  are present in all quadrants. No hepatosplenomegaly. No midline pulsatile  mass. External genitalia is atrophic.  RECTAL:  Not performed.  EXTREMITIES:  Show full range of motion, no edema and intact distal pulses.  NEUROLOGIC:  Cranial nerves II-XII are intact. Motor and sensory grossly  intact. Gait not tested.  SKIN:  Warm, dry and clear.   ACCESSORY CLINICAL DATA:  Admission hemogram is within normal limits.  Electrolytes are normal with the exception of a sodium of 130, BUN of 61 and   creatinine of 2.5. Glucose is 102. PT and PTT are normal. Liver associated  enzymes are normal. Troponin is negative x2. Initial CK was 45 with an MB of  2.2.   Electrocardiogram shows normal sinus rhythm, normal EKG.   Chest x-ray as noted by the ER physician is without active disease.   IMPRESSION:  1. Lower substernal and across the chest aching pain that radiates to the     right posterior scapula. This is entirely consistent with an attack of     cholelithiasis. There is no evidence to suggest acute myocardial     infarction or ischemia.  2. She has significant renal insufficiency with a creatinine of 2.5. This     may have been exacerbated by poor p.o. intake in the recent days.  3. Hypertension.  4. History of GERD.  5. History of anxiety and depression currently not treated apparently.    PLAN:  At this point, I think IV hydration would be appropriate and perhaps  a recheck of her BUN and creatinine. A myocardial infarction has been ruled  out though and she had normal coronaries defined by coronary angiography  nine months ago. Will proceed with discharge today. She should see Dr.  Donnie Aho on Monday or Tuesday and still plan to proceed with cholecystectomy  on Thursday, October 15.                                               Francisca December, M.D.    JHE/MEDQ  D:  08/24/2003  T:  08/24/2003  Job:  [id number]   cc:   Darden Palmer., M.D.  [address]  [place]  [place] [address]  Fax: [phone]   Ollen Gross. Vernell Morgans, M.D.  [address]  [place]  [place] [address]  Fax: [phone]

## 2011-04-02 NOTE — Discharge Summary (Signed)
Tonya Maddox, Tonya Maddox NO.:  [id number]   MEDICAL RECORD NO.:  [id number]                   PATIENT TYPE:  INP   LOCATION:  3742                                 FACILITY:  [facility]   PHYSICIAN:  Darden Palmer., M.D.         DATE OF BIRTH:  05-Feb-1928   DATE OF ADMISSION:  11/29/2002  DATE OF DISCHARGE:  11/30/2002                                 DISCHARGE SUMMARY   FINAL DIAGNOSES:  1. Chest pain of uncertain etiology, myocardial infarction ruled out.  2. Dyspnea likely due to morbid obesity and physical deconditioning.  3. Morbid obesity.   PROCEDURES:  1. Cardiac catheterization.  2. Echocardiogram.   HISTORY OF PRESENT ILLNESS:  This 77 year old female has longstanding  essential hypertension.  She has had dyspnea and bradycardia and was  scheduled for an echocardiogram.  Five days prior to admission, she  developed worsening dyspnea and chest pain.  She was seen at the emergency  room and sent home.  She returned the evening prior to admission with  increasing dyspnea with chest pain described as a sharp midsternal pain  lasting around one minute.  She was brought by ambulance to the ER again and  admitted to the rule out MI unit.  Please see the previously dictated  history and physical for the remainder of the details.   LABORATORY DATA:  Laboratory data on admission showed a hemoglobin of 14.3  and hematocrit 42.5.  The chemistry panel was within normal limits.  CPKs  and MBs were normal.  The TSH was 0.46.  The EKG showed sinus bradycardia  and no acute abnormality.  The echocardiogram showed normal left ventricular  function and normal EF.  There was decreased LV compliance.  There was mild  aortic and mitral regurgitation.  The left atrium was mildly dilated.   HOSPITAL COURSE:  She was taken to the cardiac catheterization lab and had a  catheterization done showing a normal left ventricular function.  There was  no significant  coronary artery disease noted.  An echocardiogram was done  the next day and she was discharged in improved condition.  Atenolol was  reduced and Diovan was added to her regimen.   DISPOSITION:  She was discharged in improved condition.   DISCHARGE MEDICATIONS:  1. Diovan 160 mg daily.  2. Maxzide 25 mg daily.  3. Atenolol 50 mg daily.  4. Procardia XL 60 mg daily.    ACTIVITY:  She is to walk daily and is to call if there are problems.  It is  likely thought that the dyspnea is due to a combination of severe morbid  obesity and physical deconditioning, as well as diastolic dysfunction.   FOLLOW-UP:  She is to be seen for an appointment in one week and is to call  if there are problems.  Darden Palmer., M.D.    WST/MEDQ  D:  12/19/2002  T:  12/19/2002  Job:  [id number]   cc:   Gabriel Earing, M.D.  [address]  [place]  [place] [address]  Fax: [phone]

## 2011-04-02 NOTE — Discharge Summary (Signed)
NAME:  Tonya Maddox, POTENZA                           ACCOUNT NO.:  [id number]   MEDICAL RECORD NO.:  [id number]                   PATIENT TYPE:  INP   LOCATION:  0449                                 FACILITY:  [facility]   PHYSICIAN:  Marlowe Kays, M.D.               DATE OF BIRTH:  02-15-1928   DATE OF ADMISSION:  11/25/2003  DATE OF DISCHARGE:  12/01/2003                                 DISCHARGE SUMMARY   ADMISSION DIAGNOSES:  1. Hypertension.  2. Morbid obesity.  3. Gastroesophageal reflux disease.  4. Anxiety.  5. Osteochondral fragments with mechanical derangement of the left knee.   DISCHARGE DIAGNOSES:  1. Hypertension.  2. Morbid obesity.  3. Gastroesophageal reflux disease.  4. Anxiety.  5. Osteochondral fragments with mechanical derangement of the left knee.   OPERATION:  On November 28, 2003, the patient underwent a partial lateral  meniscectomy, debridement of medial femoral condyle, lateral femoral  condyle, and patella, arthrotomy with removal of two large osteochondral  fragments.  Operation #1 and #2 was done under arthroscopic direction.  Surgeries were done by Dr. Marlowe Kays.   BRIEF HISTORY:  This 77 year old white female was admitted by Dr. Candyce Churn on November 25, 2003.  She gives a history of sudden onset of  severe left knee pain on November 25, 2003.  She was twisting her leg going  to the bathroom.  Previously had derangement in the knee at the quadriceps  in 2003.  The patient presented to the emergency room.  She was unable to  walk on the lower extremity, could not ambulate or get about at all.  Dr.  Kevan Ny admitted the patient for pain control and probable surgical  correction.  We were asked to see the patient on consult on admission.  There had been an interruption of the quadriceps mechanism, however, her  main problem was intra-articular.  X-rays of the left knee showed small  intra-articular osteophyte loose bodies with degenerative  joint disease,  narrowing of the joint space, and osteophytic spurring.  Due to the findings  and the fact that the patient could not ambulate, she was scheduled for the  above surgery.  After the surgery, the patient was markedly relieved of her  discomfort.  She was able to ambulate and weight bear to the left lower  extremity.  She did well with physical therapy, pain 2/10, the left knee  showed no erythema or skin breakdown.  The third postoperative day it was  felt the patient could be maintained in her home environment, so  arrangements were made to discharge.  The patient was transferred to our  service prior to discharge after surgery.   LABORATORY DATA:  CBC with differential with a mild normocytic normochromic  anemia with RBC at 3.73, hematocrit 35.5.  Blood chemistries were  essentially normal.  Glucose was elevated at 118, BUN was 31.  Urinalysis  negative for urinary tract infection.  Electrocardiogram showed sinus  bradycardia with first degree AV block.  No chest x-ray seen on this chart.   CONDITION ON DISCHARGE:  Improved and stable.   PLAN:  The patient is discharged to her home.  She will continue  weightbearing as tolerated on the left lower extremity.  The patient was  comfortable and confident in ambulation at discharge.  Wounds were inspected  on the day prior to discharge, and wounds were all dry, neurovascularly  intact to the left lower extremity, and the patient could straight leg  raise.  She is to wear a knee immobilizer for comfort at home.  She is to  continue with her home medications and diet.  Use Vicodin for severe pain,  Darvocet for mild pain, Robaxin as a muscle relaxant.  She is to return to  Dr. Leim Fabry clinic at which time she will see his P.A., Cherly Beach.  She is to follow up with Dr. __________at [facility] on [facility] as needed.     Dooley L. Cherlynn June.                 Marlowe Kays, M.D.     DLU/MEDQ  D:  12/17/2003  T:  12/17/2003  Job:  [id number]   cc:   [facility]   Candyce Churn, M.D.  [address] [place]  [place] [address]  Fax: [phone]

## 2011-04-02 NOTE — Consult Note (Signed)
NAME:  Tonya Maddox, Tonya Maddox                           ACCOUNT NO.:  [id number]   MEDICAL RECORD NO.:  [id number]                   PATIENT TYPE:  OBV   LOCATION:  0349                                 FACILITY:  [facility]   PHYSICIAN:  Bernette Redbird, M.D.                DATE OF BIRTH:  01/01/1928   DATE OF CONSULTATION:  08/29/2003  DATE OF DISCHARGE:                                   CONSULTATION   HISTORY OF PRESENT ILLNESS:  Dr. Carolynne Edouard asked me to see this 77 year old  female because of apparent choledocholithiasis.   Ms. Hink underwent cholecystectomy this morning after having been admitted  to the hospital several days ago with chest pain where she ruled out for an  MI and was thus subsequently felt to have symptomatic cholelithiasis.   During her surgery today, the patient had a small cystic duct, but an  intraoperative cholangiogram was able to be performed, and this showed a  possible stone in the common duct.  I have reviewed the film, and agree that  there is a filling defect in the region of the cystic duct take-off,  although it is not seen on subsequent films, and thus may be artifactual.  The duct was somewhat generous in size, probably about 8 or 9 mm, and there  was good flow of diet into the duodenum, also some retrograde filling of the  pancreatic duct in the region of the head of the pancreas.   Note that the patient's preoperative liver chemistries were normal.   At this time, the patient is quite comfortable.  There is a little bit of  residual abdominal pain, presumably due to her incisions.   PAST MEDICAL HISTORY:  No known allergies.   OUTPATIENT MEDICATIONS:  1. Nexium.  2. Atenolol.  3. Xanax.  4. Maxzide.  5. Diovan.  6. Procardia XL.   OPERATIONS:  Remote appendectomy, and the above-mentioned laparoscopic  cholecystectomy earlier today.   MEDICAL ILLNESSES:  1. Hypertension.  2. No known cardiopulmonary disease (in fact, had a negative cardiac  catheterization within the past year by Dr. Viann Fish).  3. No diabetes.  4. There is also a history of anxiety and depression.   HABITS:  Nonsmoker, nondrinker.   FAMILY HISTORY:  Colon cancer in her brother when he was in his 75's.   SOCIAL HISTORY:  The patient lives alone.  She is accompanied this evening  by her sister and her niece.   REVIEW OF SYSTEMS:  The patient previously had reflux-type symptoms which  have been well controlled on Nexium, which has, however, led to some degree  of diarrhea.  No severe diarrhea, no rectal bleeding, no trouble swallowing,  loss of appetite, involuntary weight loss.   PHYSICAL EXAMINATION:  GENERAL:  Ms. Spitler is a healthy-appearing, somewhat  overweight female, in no evident distress, appearing neither anxious nor  depressed.  The patient is  anicteric and without evident pallor.  CHEST:  Clear.  HEART:  Normal.  ABDOMEN:  Soft, although slightly tender over her incision, especially on  the right side.   LABORATORY DATA:  White count 6100, hemoglobin 13.3, platelets 233,000.  Chemistry panel normal, except for a glucose of 102.  BUN 61, creatinine 2.5  (the latter of which were repeated since the above labs were obtained about  a week ago, and a current BUN is 21 and creatinine 2.0).  Amylase and lipase  were not checked.   Total bilirubin was 0.8, alkaline phosphatase 74, SGOT 22, SGPT 17, as of  August 23, 2003.   Intraoperative cholangiogram - I have reviewed the films, and my impression  is as above.   IMPRESSION:  1. Possible choledocholithiasis with abnormal intraoperative cholangiogram.  2. Status post cholecystectomy with documented cholecystitis and     cholelithiasis.  3. Generously-sized common bile duct on intraoperative cholangiogram and     preoperative ultrasound.  4. Chronic renal insufficiency.  5. Hypertension.   DISCUSSION AND RECOMMENDATIONS:  I think that there is a moderate  possibility of a common  duct stone being present.  With the normal liver  chemistries, and the equivocal cholangiogram, it would ordinarily be my  inclination to do an MRI cholangiogram first, and then the ERCP only if the  MRI scan was positive for a stone.  However, this patient indicates that she  was previously given an MRI scan and was not able to tolerate it due to  claustrophobia, even with sedation.  Thus, I do not think that she is  realistically a candidate for MRI cholangiography.  The open MRI's, per  discussion with the radiologist tonight, are not of sufficient strength to  permit an MRCP.   Thus, I reviewed with the patient, as well as her niece, Rivka Barbara, and her  sister, Santina Evans, who were at the bedside tonight, all options including  watchful waiting, ERCP with possible sphincterotomy and stone extraction,  and MRI cholangiography, along with the pro's and con's of each course of  action.  The patient is agreeable to whatever I feel is most appropriate.  I  feel, given the anticipated inability to get a diagnostic MRCP, and given  the definite ductal dilatation and the apparent presence of the stone  cholangiography, that the risk of an ERCP is warranted.  I did review the  risk in detail with the patient and her family members, including a 5% risk  of pancreatitis, which can occasionally be fatal, as well as other problems  such as cardiopulmonary reactions, bleeding, perforation, need for emergency  surgery, and so forth.  The patient is agreeable to proceed, so we will plan  this for tomorrow.                                               Bernette Redbird, M.D.    RB/MEDQ  D:  08/29/2003  T:  08/30/2003  Job:  [id number]   cc:   Gabriel Earing, M.D.  [address]  [place]  [place] [address]  Fax: [phone]   W. Ashley Royalty., M.D.  [address], [address]  [place]  [place] [address]  Fax: [phone]   Ollen Gross. Vernell Morgans, M.D. [address]  [place]  [place] [address]   Fax: [phone]

## 2011-04-02 NOTE — Op Note (Signed)
NAME:  Tonya Maddox, Tonya Maddox NO.:  [id number]   MEDICAL RECORD NO.:  [id number]                   PATIENT TYPE:  INP   LOCATION:                                       FACILITY:  [facility]   PHYSICIAN:  Bernette Redbird, M.D.                DATE OF BIRTH:  Jul 25, 1928   DATE OF PROCEDURE:  08/30/2003  DATE OF DISCHARGE:  09/01/2003                                 OPERATIVE REPORT   OPERATION/PROCEDURE:  Endoscopic retrograde cholangiopancreatography with  sphincterotomy.   INDICATIONS:  This is a 77 year old female with an abnormal intraoperative  cholangiogram during cholecystectomy yesterday.  The patient's liver  chemistries went up slightly this morning but were normal preoperatively.   FINDINGS:  No filling defect identified.  Empiric 8 mm sphincterotomy  performed.   DESCRIPTION OF PROCEDURE:  The nature, purpose, risks of the procedure had  been very carefully discussed with the patient and her sister and niece and  she provided written consent, being brought in a fasted state from her  hospital room to the radiology suite.  She had received Rocephin 1 g IV for  antibiotic prophylaxis.  Sedation was fentanyl  62.5 mcg and Versed 7 mg IV  without arrhythmias or desaturation during the exam.  The Olympus video  duodenoscope was passed blindly into the esophagus without significant  difficulty and advanced through a grossly normal-appearing stomach into a  normal-appearing duodenum where the major papilla was rapidly identified.  It was not particularly large.  There was a vertical linear slit raising the  question as to whether or not stone may have previously passed.   I used the triple-lumen sphincterotome preloaded with a Jag wire to  cannulate.  The guide wire initially went into the pancreatic duct (no dye  was injected) and after repositioning, I was able to get the guide wire to  go off selectively into the common bile duct and, thus, could  cannulate  selectively.  Cholangiography demonstrated no filling defects but a slightly  dilated (6-8 mm) duct.   The fluoroscopy was viewed by Dr. Ronney Asters of radiology and, although  neither he nor I could identify any stones at this time, I elected to  proceed with conservative sphincterotomy which was performed using the ERBE  coagulator with no bleeding.  There was nice drainage of bile thereafter.  We did a single balloon pull-through with an 8.5 mm balloon which would not  readily come through the sphincterotomy without being deflated but during  pull-through I did not see any debris or stones come through.  I did an  inclusion cholangiogram prior to pulling the balloon-tip catheter out and  this disclosed again no filling defects.  The guide wire balloon-tip  catheter and scope were removed and the patient had additional spot films  obtained in the oblique position.   The patient tolerated the procedure well and  there were no evidence  complications.   IMPRESSION:  1. No filling defect seen during today's cholangiogram to correlate with the     abnormalities seen on yesterday's intraoperative cholangiogram.  2. Slightly dilated common bile duct.  3. Empiric 8 mm sphincterotomy performed as discussed above.  4. Pancreatic duct was entered with a guide wire x1 but was not injected or     visualized during this exam.  5. Single balloon pull-through performed without any evident stones or     debris being delivered.   PLAN:  The patient will be observed for complications and we will obtain  followup labs in the morning.  If she remains clinically stable, she should  be able to be discharged from the post sphincterotomy standpoint in the  morning.                                               Bernette Redbird, M.D.    RB/MEDQ  D:  08/30/2003  T:  08/30/2003  Job:  [id number]   cc:   Ollen Gross. Vernell Morgans, M.D.  [address]  [place]  [place] [address]  Fax:  [phone]   Gabriel Earing, M.D.  [address]  [place]  [place] [address]  Fax: [phone]   W. Ashley Royalty., M.D.  [address]  [place]  [place] [address]  Fax: [phone]

## 2011-04-02 NOTE — Cardiovascular Report (Signed)
   Tonya Maddox, GIANNOTTI NO.:  [id number]   MEDICAL RECORD NO.:  [id number]                   PATIENT TYPE:  INP   LOCATION:  6529                                 FACILITY:  [facility]   PHYSICIAN:  W. Ashley Royalty., M.D.         DATE OF BIRTH:  21-May-1928   DATE OF PROCEDURE:  11/29/2002  DATE OF DISCHARGE:                              CARDIAC CATHETERIZATION   HISTORY OF PRESENT ILLNESS:  The patient is a 77 year old female admitted  with recurrent emergency room visits for palpitations, dyspnea, and chest  pain.  She came in with a recurrent chest discomfort and she is brought in  to evaluate for possible coronary artery disease.   COMMENTS ABOUT PROCEDURE:  The patient tolerated the procedure well without  complications and following the procedure she had good peripheral pulses and  hemostasis noted.   HEMODYNAMIC DATA:  Aorta post-contrast 150/70, LV post-contrast 150/10.   ANGIOGRAPHIC DATA:   LEFT VENTRICULOGRAM:  Left ventriculogram performed in the 30-degree RAO  projection.  The aortic valve was normal. The mitral valve appears normal.  The left ventricle appears normal in size.  The ventricle appears to have  increased trabeculations consistent with hypertrophy.  Left ventricular  systolic function is normal and estimated at 70%.  Coronary arteries arise  and distribute normally. There is no significant coronary calcification is  noted.   The left main coronary artery:  Normal.   Left anterior descending:  Left anterior descending has two diagonal  branches and appears free of disease.   The circumflex coronary artery:  The circumflex coronary artery has two  marginal arteries which are mildly tortuous but free of disease.   The right coronary artery:  This is a large dominant vessel.  The posterior  descending supplies the apex.  There are two posterolateral branches.  There  is no significant stenoses noted.   IMPRESSION:  1.  Normal left ventricular function.  2. No significant coronary artery disease identified.    RECOMMENDATIONS:  Evaluate for other causes of dyspnea.  The patient does  not appear to have dyspnea coming from her left ventricle or coronary artery  disease.                                                Darden Palmer., M.D.    WST/MEDQ  D:  11/29/2002  T:  11/29/2002  Job:  [id number]   cc:   Gabriel Earing, M.D.  [address]  [place]  [place] [address]  Fax: [phone]

## 2011-04-02 NOTE — H&P (Signed)
NAME:  Tonya Maddox, Tonya Maddox                           ACCOUNT NO.:  [id number]   MEDICAL RECORD NO.:  [id number]                   PATIENT TYPE:  INP   LOCATION:  0449                                 FACILITY:  [facility]   PHYSICIAN:  Candyce Churn, M.D.          DATE OF BIRTH:  1928-10-16   DATE OF ADMISSION:  11/25/2003  DATE OF DISCHARGE:                                HISTORY & PHYSICAL   DATE OF WRITTEN HISTORY AND PHYSICAL:  November 25, 2003.   DATE OF DICTATED HISTORY AND PHYSICAL:  November 27, 2003.   CHIEF COMPLAINT:  Left knee pain/recurrent injury.   HISTORY OF PRESENT ILLNESS:  Tonya Maddox is a 77 year old female with a  history of:  1. Hypertension for many years, diagnosed at age 30.  2. Morbid obesity.  3. GERD.  4. DJD of the knees, severe.  5. Lower extremity venous insufficiency.  6. Chronic bradycardia.  7. Chronic anxiety.   She presents with sudden onset of severe left knee pain at approximately  12:30 a.m. on November 25, 2003, when twisting her leg to go to the bathroom.  She had a prior tear partial of her quadriceps apparently in 2003.  She  states by history from the ER physician that she was injected with a steroid  in the past with some relief of pain at that time.  She has done well until  this morning, and now she cannot walk on the left lower extremity, cannot  ambulate, cannot walk at all secondary to her obesity, DJD, and pain  involving the left lower thigh and knee.  She is admitted now for pain  control and probable surgical correction.   MEDICATIONS:  1. Maxzide 37.5/25, 1 daily.  2. Atenolol 50 mg a day.  3. Procardia XL 30 mg a day.  4. Nexium 40 mg a day.  5. Diovan 160 mg a day.   ALLERGIES:  No known allergies to drugs.   PAST SURGICAL HISTORY:  1. Status post appendectomy.  2. Status post cholecystectomy per Dr. Carolynne Edouard October 2004.  3. ERCP with sphincterotomy the day after her cholecystectomy in October     2004, Dr. Nadine Counts  Buccini.  4. Cardiac catheterization, Dr. Viann Fish, January 2004.  Normal LV     function, and no significant coronary artery disease.   FAMILY HISTORY:  She has 10 siblings.  Their medical history includes  prostate cancer, colon cancer, breast cancer.  Father had hypertension and  peptic ulcer disease.  Mother died at age 31.  A sister has had rheumatic  heart disease and atrial fibrillation.   HABITS:  No tobacco, no alcohol.   SOCIAL HISTORY:  Apparently single though she has a female friend with her in  the emergency room.  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   REVIEW OF SYSTEMS:  Negative chest pain.  Negative short of breath.  No  fever.  No chills.  No abdominal pain.  No dysuria.   PHYSICAL EXAM:  GENERAL:  Obese female with decreased pain now after Toradol  injection within the past hour or so.  Is complaining of pain still, though,  however, over the distal left thigh and upper left knee.  VITAL SIGNS:  Temperature 97.5, blood pressure 115/63, pulse 56 and regular,  respiratory rate 16 and nonlabored.  She has 95% saturation on room air.  HEENT:  Atraumatic, normocephalic.  Oropharynx is clear and moist.  NECK:  Supple.  Without JVD or bruits.  No thyromegaly.  CHEST:  Clear to auscultation bilaterally.  CARDIAC:  Regular rhythm without murmur, rub, or gallop.  ABDOMEN:  Obese, soft, nontender.  Bowel sounds are normal.  She does have a  well-healed appendectomy scar.  EXTREMITIES:  Without edema.  Left lower extremity with fullness just above  the left kneecap with what looks like an early ecchymosis forming over the  left knee patella.  She is tender to palpation in this area.  Cannot extend  or flex leg.  NEUROLOGIC:  Nonfocal.  She is alert and oriented.   X-rays:  Left knee with extensive patellar and articular degenerative  changes.   EKG:  Sinus bradycardia with first-degree AV block and rate in the 50s.    White count 6300, hemoglobin 12.2, platelet count 179,000.  Sodium 135,  potassium 3.5, chloride 108, bicarbonate 25, BUN 31, and creatinine 1.3,  glucose 118.  LFTs are normal.  CPK 30.  PT and INR are pending.   ASSESSMENT:  This is a 77 year old female with hypertension which is  controlled, morbid obesity, gastroesophageal reflux disease, anxiety, with  reinjury to the left quadriceps/left knee.  She cannot walk.  Had cardiac  workup in January 2004, which was negative for significant coronary artery  disease, and she has no history of pulmonary disease.  She is likely a good  surgical candidate except for her obesity.   PLAN:  1. Admit, and use left knee immobilizer.  2. Consider prophylactic Lovenox.  3. Orthopedics consult per Dr. Otelia Sergeant.  4. Continue usual medications, and use IV Toradol for pain q.12h. p.r.n.                                               Candyce Churn, M.D.    RNG/MEDQ  D:  11/27/2003  T:  11/27/2003  Job:  [id number]   cc:   Gabriel Earing, M.D.  [address]  [place]  [place] [address]  Fax: [phone]   Kerrin Champagne, M.D.  [address]  [place]  [place] [address]  Fax: [phone]   W. Ashley Royalty., M.D.  [address]  [place]  [place] [address]  Fax: [phone]

## 2011-08-09 LAB — BASIC METABOLIC PANEL
BUN: 10
BUN: 11
BUN: 12
BUN: 15
BUN: 8
BUN: 8
BUN: 9
CO2: 26
CO2: 27
CO2: 28
CO2: 29
CO2: 30
Calcium: 8.2 — ABNORMAL LOW
Calcium: 8.8
Calcium: 8.8
Calcium: 8.9
Chloride: 90 — ABNORMAL LOW
Chloride: 92 — ABNORMAL LOW
Chloride: 94 — ABNORMAL LOW
Chloride: 97
Creatinine, Ser: 0.93
Creatinine, Ser: 0.93
Creatinine, Ser: 0.94
Creatinine, Ser: 1
GFR calc Af Amer: 58 — ABNORMAL LOW
GFR calc Af Amer: >60
GFR calc Af Amer: >60
GFR calc Af Amer: >60
GFR calc non Af Amer: 54 — ABNORMAL LOW
GFR calc non Af Amer: 56 — ABNORMAL LOW
GFR calc non Af Amer: 58 — ABNORMAL LOW
GFR calc non Af Amer: 58 — ABNORMAL LOW
GFR calc non Af Amer: >60
Glucose, Bld: 100 — ABNORMAL HIGH
Glucose, Bld: 100 — ABNORMAL HIGH
Glucose, Bld: 101 — ABNORMAL HIGH
Glucose, Bld: 104 — ABNORMAL HIGH
Glucose, Bld: 105 — ABNORMAL HIGH
Glucose, Bld: 106 — ABNORMAL HIGH
Glucose, Bld: 88
Glucose, Bld: 97
Glucose, Bld: 98
Potassium: 2.9 — ABNORMAL LOW
Potassium: 3.3 — ABNORMAL LOW
Potassium: 3.4 — ABNORMAL LOW
Potassium: 3.6
Potassium: 4.4
Sodium: 119 — CL
Sodium: 120 — ABNORMAL LOW
Sodium: 124 — ABNORMAL LOW
Sodium: 127 — ABNORMAL LOW
Sodium: 130 — ABNORMAL LOW
Sodium: 131 — ABNORMAL LOW

## 2011-08-09 LAB — URINALYSIS, ROUTINE W REFLEX MICROSCOPIC
Bilirubin Urine: NEGATIVE
Hgb urine dipstick: NEGATIVE
Protein, ur: NEGATIVE
Specific Gravity, Urine: 1.01
Urobilinogen, UA: 0.2

## 2011-08-09 LAB — I-STAT 8, (EC8 V) (CONVERTED LAB)
Bicarbonate: 24.4 — ABNORMAL HIGH
Chloride: 85 — ABNORMAL LOW
Glucose, Bld: 107 — ABNORMAL HIGH
HCT: 44
Hemoglobin: 15
Operator id: 277751
Potassium: 3.5
TCO2: 26
pCO2, Ven: 40.7 — ABNORMAL LOW

## 2011-08-09 LAB — COMPREHENSIVE METABOLIC PANEL
ALT: 13
AST: 20
AST: 21
AST: 25
Albumin: 3.1 — ABNORMAL LOW
Albumin: 3.7
Alkaline Phosphatase: 55
Alkaline Phosphatase: 56
CO2: 29
Calcium: 8.6
Chloride: 99
Creatinine, Ser: 0.83
Creatinine, Ser: 0.97
Creatinine, Ser: 1.2
GFR calc Af Amer: 53 — ABNORMAL LOW
GFR calc Af Amer: >60
GFR calc Af Amer: >60
GFR calc non Af Amer: 44 — ABNORMAL LOW
GFR calc non Af Amer: 56 — ABNORMAL LOW
Potassium: 3.9
Total Bilirubin: 0.9
Total Protein: 6

## 2011-08-09 LAB — DIFFERENTIAL
Basophils Relative: 0
Eosinophils Relative: 1
Lymphocytes Relative: 24
Lymphs Abs: 1.6
Neutro Abs: 4.5
Neutrophils Relative %: 66

## 2011-08-09 LAB — CARDIAC PANEL(CRET KIN+CKTOT+MB+TROPI)
CK, MB: 3.2
Total CK: 69

## 2011-08-09 LAB — URINE CULTURE: Colony Count: 6000

## 2011-08-09 LAB — POCT CARDIAC MARKERS
CKMB, poc: <1 — ABNORMAL LOW
Operator id: 277751
Troponin i, poc: <0.05

## 2011-08-09 LAB — CBC
HCT: 38.6
MCHC: 34.8
MCV: 96.2
MCV: 97.1
Platelets: 186
RBC: 4.01
RDW: 12.2
RDW: 12.4
WBC: 5.4

## 2011-08-09 LAB — LIPID PANEL
Cholesterol: 108
HDL: 46

## 2011-08-09 LAB — PROTIME-INR: INR: 0.9

## 2011-08-09 LAB — LIPASE, BLOOD: Lipase: 25

## 2011-08-09 LAB — CK TOTAL AND CKMB (NOT AT ARMC)
CK, MB: 2.4
Relative Index: INVALID
Total CK: 70

## 2011-08-09 LAB — APTT: aPTT: 28

## 2011-08-09 LAB — TSH: TSH: 0.863

## 2011-08-09 LAB — B-NATRIURETIC PEPTIDE (CONVERTED LAB): Pro B Natriuretic peptide (BNP): 57

## 2011-08-09 LAB — TROPONIN I: Troponin I: 0.03

## 2013-02-13 DIAGNOSIS — I442 Atrioventricular block, complete: Secondary | ICD-10-CM

## 2013-02-13 HISTORY — DX: Atrioventricular block, complete: I44.2

## 2013-02-18 ENCOUNTER — Encounter (HOSPITAL_COMMUNITY): Payer: Self-pay | Admitting: *Deleted

## 2013-02-18 ENCOUNTER — Emergency Department (HOSPITAL_COMMUNITY): Payer: Medicare Other

## 2013-02-18 ENCOUNTER — Inpatient Hospital Stay (HOSPITAL_COMMUNITY)
Admission: EM | Admit: 2013-02-18 | Discharge: 2013-02-20 | DRG: 243 | Disposition: A | Discharge status: Home Health | Payer: Medicare Other | Attending: Internal Medicine | Admitting: Internal Medicine

## 2013-02-18 DIAGNOSIS — Z6831 Body mass index (BMI) 31.0-31.9, adult: Secondary | ICD-10-CM

## 2013-02-18 DIAGNOSIS — M1610 Unilateral primary osteoarthritis, unspecified hip: Secondary | ICD-10-CM | POA: Diagnosis present

## 2013-02-18 DIAGNOSIS — E669 Obesity, unspecified: Secondary | ICD-10-CM | POA: Diagnosis present

## 2013-02-18 DIAGNOSIS — F3289 Other specified depressive episodes: Secondary | ICD-10-CM | POA: Diagnosis present

## 2013-02-18 DIAGNOSIS — I10 Essential (primary) hypertension: Secondary | ICD-10-CM | POA: Diagnosis present

## 2013-02-18 DIAGNOSIS — Z950 Presence of cardiac pacemaker: Secondary | ICD-10-CM | POA: Diagnosis present

## 2013-02-18 DIAGNOSIS — E871 Hypo-osmolality and hyponatremia: Secondary | ICD-10-CM | POA: Diagnosis present

## 2013-02-18 DIAGNOSIS — I498 Other specified cardiac arrhythmias: Secondary | ICD-10-CM | POA: Diagnosis present

## 2013-02-18 DIAGNOSIS — F411 Generalized anxiety disorder: Secondary | ICD-10-CM | POA: Diagnosis present

## 2013-02-18 DIAGNOSIS — I499 Cardiac arrhythmia, unspecified: Secondary | ICD-10-CM | POA: Diagnosis present

## 2013-02-18 DIAGNOSIS — I442 Atrioventricular block, complete: Secondary | ICD-10-CM

## 2013-02-18 DIAGNOSIS — Z8249 Family history of ischemic heart disease and other diseases of the circulatory system: Secondary | ICD-10-CM

## 2013-02-18 DIAGNOSIS — M1710 Unilateral primary osteoarthritis, unspecified knee: Secondary | ICD-10-CM | POA: Diagnosis present

## 2013-02-18 DIAGNOSIS — R001 Bradycardia, unspecified: Secondary | ICD-10-CM

## 2013-02-18 DIAGNOSIS — R42 Dizziness and giddiness: Secondary | ICD-10-CM | POA: Insufficient documentation

## 2013-02-18 DIAGNOSIS — F419 Anxiety disorder, unspecified: Secondary | ICD-10-CM | POA: Diagnosis present

## 2013-02-18 DIAGNOSIS — K219 Gastro-esophageal reflux disease without esophagitis: Secondary | ICD-10-CM | POA: Diagnosis present

## 2013-02-18 DIAGNOSIS — H9190 Unspecified hearing loss, unspecified ear: Secondary | ICD-10-CM | POA: Diagnosis present

## 2013-02-18 DIAGNOSIS — I441 Atrioventricular block, second degree: Secondary | ICD-10-CM | POA: Diagnosis present

## 2013-02-18 DIAGNOSIS — R0789 Other chest pain: Secondary | ICD-10-CM | POA: Diagnosis present

## 2013-02-18 DIAGNOSIS — F329 Major depressive disorder, single episode, unspecified: Secondary | ICD-10-CM | POA: Diagnosis present

## 2013-02-18 DIAGNOSIS — Z79899 Other long term (current) drug therapy: Secondary | ICD-10-CM

## 2013-02-18 DIAGNOSIS — E876 Hypokalemia: Secondary | ICD-10-CM | POA: Diagnosis present

## 2013-02-18 DIAGNOSIS — I491 Atrial premature depolarization: Secondary | ICD-10-CM | POA: Diagnosis present

## 2013-02-18 DIAGNOSIS — F32A Depression, unspecified: Secondary | ICD-10-CM | POA: Diagnosis present

## 2013-02-18 HISTORY — DX: Syncope and collapse: R55

## 2013-02-18 HISTORY — DX: Obesity, unspecified: E66.9

## 2013-02-18 HISTORY — DX: Unspecified osteoarthritis, unspecified site: M19.90

## 2013-02-18 HISTORY — DX: Dizziness and giddiness: R42

## 2013-02-18 HISTORY — DX: Anxiety disorder, unspecified: F41.9

## 2013-02-18 HISTORY — DX: Bradycardia, unspecified: R00.1

## 2013-02-18 HISTORY — DX: Major depressive disorder, single episode, unspecified: F32.9

## 2013-02-18 HISTORY — DX: Hypo-osmolality and hyponatremia: E87.1

## 2013-02-18 HISTORY — DX: Gastro-esophageal reflux disease without esophagitis: K21.9

## 2013-02-18 HISTORY — DX: Essential (primary) hypertension: I10

## 2013-02-18 HISTORY — DX: Depression, unspecified: F32.A

## 2013-02-18 HISTORY — DX: Atrioventricular block, complete: I44.2

## 2013-02-18 HISTORY — DX: Cardiac arrhythmia, unspecified: I49.9

## 2013-02-18 HISTORY — DX: Unspecified hearing loss, unspecified ear: H91.90

## 2013-02-18 LAB — TROPONIN I
Troponin I: <0.3 ng/mL (ref <0.30)
Troponin I: <0.3 ng/mL (ref <0.30)

## 2013-02-18 LAB — HEPATIC FUNCTION PANEL
ALT: 11 U/L (ref 0–35)
AST: 17 U/L (ref 0–37)
Alkaline Phosphatase: 64 U/L (ref 39–117)
Indirect Bilirubin: 0.4 mg/dL (ref 0.3–0.9)
Total Protein: 6.2 g/dL (ref 6.0–8.3)

## 2013-02-18 LAB — BASIC METABOLIC PANEL
CO2: 24 mEq/L (ref 19–32)
Calcium: 9.5 mg/dL (ref 8.4–10.5)
Creatinine, Ser: 0.88 mg/dL (ref 0.50–1.10)
GFR calc non Af Amer: 60 mL/min — ABNORMAL LOW (ref >90)
Sodium: 119 mEq/L — CL (ref 135–145)

## 2013-02-18 LAB — CBC WITH DIFFERENTIAL/PLATELET
Basophils Absolute: 0 10*3/uL (ref 0.0–0.1)
Eosinophils Absolute: 0 10*3/uL (ref 0.0–0.7)
Eosinophils Relative: 0 % (ref 0–5)
Lymphocytes Relative: 15 % (ref 12–46)
Lymphs Abs: 1.2 10*3/uL (ref 0.7–4.0)
MCH: 32.8 pg (ref 26.0–34.0)
MCV: 88.7 fL (ref 78.0–100.0)
Neutrophils Relative %: 77 % (ref 43–77)
Platelets: 189 10*3/uL (ref 150–400)
RBC: 4.67 MIL/uL (ref 3.87–5.11)
RDW: 12 % (ref 11.5–15.5)
WBC: 8.1 10*3/uL (ref 4.0–10.5)

## 2013-02-18 LAB — MAGNESIUM: Magnesium: 1.6 mg/dL (ref 1.5–2.5)

## 2013-02-18 LAB — LIPID PANEL
Cholesterol: 117 mg/dL (ref 0–200)
HDL: 56 mg/dL (ref >39)
Total CHOL/HDL Ratio: 2.1 RATIO

## 2013-02-18 LAB — APTT: aPTT: 32 seconds (ref 24–37)

## 2013-02-18 LAB — PROTIME-INR: Prothrombin Time: 13.2 seconds (ref 11.6–15.2)

## 2013-02-18 MED ORDER — MAGNESIUM OXIDE 400 (241.3 MG) MG PO TABS
400.0000 mg | ORAL_TABLET | Freq: Once | ORAL | Status: AC
  Administered 2013-02-19: 400 mg via ORAL
  Filled 2013-02-18: qty 1

## 2013-02-18 MED ORDER — SODIUM CHLORIDE 0.9 % IJ SOLN
3.0000 mL | Freq: Two times a day (BID) | INTRAMUSCULAR | Status: DC
  Administered 2013-02-19: 3 mL via INTRAVENOUS

## 2013-02-18 MED ORDER — SODIUM CHLORIDE 0.9 % IV SOLN
INTRAVENOUS | Status: AC
  Administered 2013-02-18: 19:00:00 via INTRAVENOUS

## 2013-02-18 MED ORDER — SODIUM CHLORIDE 0.9 % IJ SOLN: 3.0000 mL | INTRAMUSCULAR | Status: DC | PRN

## 2013-02-18 MED ORDER — SODIUM CHLORIDE 0.9 % IJ SOLN: 3.0000 mL | Freq: Two times a day (BID) | INTRAMUSCULAR | Status: DC

## 2013-02-18 MED ORDER — SODIUM CHLORIDE 0.9 % IV SOLN: 250.0000 mL | INTRAVENOUS | Status: DC | PRN

## 2013-02-18 MED ORDER — ASPIRIN 81 MG PO CHEW
324.0000 mg | CHEWABLE_TABLET | Freq: Once | ORAL | Status: AC
  Administered 2013-02-18: 324 mg via ORAL
  Filled 2013-02-18: qty 4

## 2013-02-18 MED ORDER — HEPARIN SODIUM (PORCINE) 5000 UNIT/ML IJ SOLN
5000.0000 [IU] | Freq: Three times a day (TID) | INTRAMUSCULAR | Status: DC
  Administered 2013-02-18 – 2013-02-20 (×3): 5000 [IU] via SUBCUTANEOUS
  Filled 2013-02-18 (×9): qty 1

## 2013-02-18 MED ORDER — FUROSEMIDE 10 MG/ML IJ SOLN
20.0000 mg | Freq: Once | INTRAMUSCULAR | Status: AC
  Administered 2013-02-19: 20 mg via INTRAVENOUS
  Filled 2013-02-18: qty 2

## 2013-02-18 NOTE — ED Provider Notes (Signed)
History     CSN: [id number]  Arrival date & time 02/18/13  1654   First MD Initiated Contact with Patient 02/18/13 1709      Chief Complaint  Patient presents with  . Shortness of Breath  . Palpitations    (Consider location/radiation/quality/duration/timing/severity/associated sxs/prior treatment) Patient is a 79 y.o. female presenting with shortness of breath and palpitations.  Shortness of Breath Palpitations  Associated symptoms include shortness of breath.   Pt reports she has had intermittent episodes of SOB, palpations and chest pressure off and on for a couple of weeks. No particular provoking or relieving factors. Symptoms come and go without warning. She had a particularly bad episode earlier today and so she called EMS. She is asymptomatic at the time of my evaluation. She has no known heart disease, last saw cardiologist 8 years ago. She has not had any change in her medications recently  She has a secondary complaint of anhedonia/agoraphobia like symptoms ongoing for several months, rarely leaves her room or house, does not see other people much or talk on the phone. States 'I have nothing to be depressed about!' She has brought these concerns up with her PCP who has only recommended continuing her Xanax which does not help.   Past Medical History  Diagnosis Date  . Depression   . Hypertension   . Vertigo   . GERD (gastroesophageal reflux disease)   . Arrhythmia   . Anxiety     Past Surgical History  Procedure Laterality Date  . Knee arthroscopy    . Cholecystectomy    . Appendectomy      No family history on file.  History  Substance Use Topics  . Smoking status: Never Smoker   . Smokeless tobacco: Not on file  . Alcohol Use: No    OB History   Grav Para Term Preterm Abortions TAB SAB Ect Mult Living                  Review of Systems  Respiratory: Positive for shortness of breath.   Cardiovascular: Positive for palpitations.   All other  systems reviewed and are negative except as noted in HPI.   Allergies  Review of patient's allergies indicates no known allergies.  Home Medications   Current Outpatient Rx  Name  Route  Sig  Dispense  Refill  . ALPRAZolam (XANAX XR) 1 MG 24 hr tablet   Oral   Take 1 mg by mouth every morning.         Marland Kitchen esomeprazole (NEXIUM) 40 MG capsule   Oral   Take 40 mg by mouth daily as needed. For heartburn         . meclizine (ANTIVERT) 25 MG tablet   Oral   Take 25 mg by mouth 3 (three) times daily as needed. For dizziness         . Multiple Vitamin (MULTIVITAMIN WITH MINERALS) TABS   Oral   Take 1 tablet by mouth daily.         . Multiple Vitamins-Minerals (PRESERVISION AREDS PO)   Oral   Take 1 tablet by mouth 2 (two) times daily.         Marland Kitchen NIFEdipine (NIFEDICAL XL) 60 MG 24 hr tablet   Oral   Take 60 mg by mouth daily.         Marland Kitchen triamterene-hydrochlorothiazide (MAXZIDE-25) 37.5-25 MG per tablet   Oral   Take 1 tablet by mouth daily.         Marland Kitchen  valsartan (DIOVAN) 320 MG tablet   Oral   Take 320 mg by mouth daily.           BP 147/78  Pulse 49  Temp(Src) 97.6 F (36.4 C) (Oral)  Resp 16  SpO2 99%  Physical Exam  Nursing note and vitals reviewed. Constitutional: She is oriented to person, place, and time. She appears well-developed and well-nourished.  HENT:  Head: Normocephalic and atraumatic.  Eyes: EOM are normal. Pupils are equal, round, and reactive to light.  Neck: Normal range of motion. Neck supple.  Cardiovascular: Normal heart sounds and intact distal pulses.  Bradycardia present.   Pulmonary/Chest: Effort normal and breath sounds normal.  Abdominal: Bowel sounds are normal. She exhibits no distension. There is no tenderness.  Musculoskeletal: Normal range of motion. She exhibits no edema and no tenderness.  Neurological: She is alert and oriented to person, place, and time. She has normal strength. No cranial nerve deficit or sensory  deficit.  Skin: Skin is warm and dry. No rash noted.  Psychiatric: She has a normal mood and affect.    ED Course  Procedures (including critical care time)  Labs Reviewed  BASIC METABOLIC PANEL - Abnormal; Notable for the following:    Sodium 119 (*)    Chloride 83 (*)    Glucose, Bld 102 (*)    GFR calc non Af Amer 60 (*)    GFR calc Af Amer 69 (*)    All other components within normal limits  CBC WITH DIFFERENTIAL - Abnormal; Notable for the following:    Hemoglobin 15.3 (*)    MCHC 37.0 (*)    All other components within normal limits  TROPONIN I   No results found.   1. Bradycardia   2. Hyponatremia   3. Depression       MDM   Date: 02/18/2013  Rate: 40  Rhythm: sinus bradycardia  QRS Axis: normal  Intervals: normal  ST/T Wave abnormalities: normal  Conduction Disutrbances:none  Narrative Interpretation:   Old EKG Reviewed: none available  Pt with symptomatic bradycardia, unclear etiology. Also hyponatremic, states she has been drinking excessive amounts of water daily. Admit for further eval.       Mckinley Adelstein B. Bernette Mayers, MD 02/18/13 Izell Martorell

## 2013-02-18 NOTE — H&P (Signed)
Hospital Admission Note Date: 02/18/2013  Patient name: Tonya Maddox Medical record number: [id number] Date of birth: [birth year] Age: 79 y.o. Gender: female PCP: Dr. Providence Lanius  Medical Service: Internal Medicine  Attending physician: Dr. Kem Kays     1st Contact: Dr. Heloise Beecham [phone] 2nd Contact: Dr. Manson Passey Pager:[phone] After 5 pm or weekends:  1st Contact: Pager: [phone] 2nd Contact: Pager: [phone]  Chief Complaint: SOB, palpitations, chest pressure/tightness  History of Present Illness: 79 y.o PMH HTN, syncope, arrhythmia, palpitations/bradycardia presents with intermittent SOB (rest and exertion) x 2 weeks , intermittent "heart fluttering"/palpitations, chest tightness/pressure intermittently x months though worsening (worse today) prompting her to call EMS.  Sensation is 5/10 central chest without radiation and no reproducibility.  She states she has been having a "flickering" that goes away.  No radiation of pain.  She denies orthopnea.  Nothing makes the chest sensation worse.  Sometimes Xanax helps her chest sensation.  EMS stated her HR was in the 40s.    Meds: Medications Prior to Admission  Medication Sig Dispense Refill  . ALPRAZolam (XANAX XR) 1 MG 24 hr tablet Take 1 mg by mouth every morning.      Marland Kitchen esomeprazole (NEXIUM) 40 MG capsule Take 40 mg by mouth daily as needed. For heartburn      . meclizine (ANTIVERT) 25 MG tablet Take 25 mg by mouth 3 (three) times daily as needed. For dizziness      . Multiple Vitamin (MULTIVITAMIN WITH MINERALS) TABS Take 1 tablet by mouth daily.      . Multiple Vitamins-Minerals (PRESERVISION AREDS PO) Take 1 tablet by mouth 2 (two) times daily.      Marland Kitchen NIFEdipine (NIFEDICAL XL) 60 MG 24 hr tablet Take 60 mg by mouth daily.      Marland Kitchen triamterene-hydrochlorothiazide (MAXZIDE-25) 37.5-25 MG per tablet Take 1 tablet by mouth daily.      . valsartan (DIOVAN) 320 MG tablet Take 320 mg by mouth daily.       Intermittently taking Nexium    Allergies: Allergies as of 02/18/2013  . (No Known Allergies)   Past Medical History  Diagnosis Date  . Depression   . Hypertension   . Vertigo   . GERD (gastroesophageal reflux disease)   . Arrhythmia   . Anxiety   . Syncope     2005  . Palpitations     2005  . Bradycardia     2004  . Arthritis     knees and right hip   Past Surgical History  Procedure Laterality Date  . Knee arthroscopy    . Cholecystectomy    . Appendectomy    . Other surgical history      partial menisectomy    Family History  Problem Relation Age of Onset  . Heart Problems      died at 26   History   Social History  . Marital Status: Single    Spouse Name: N/A    Number of Children: N/A  . Years of Education: N/A   Occupational History  . Not on file.   Social History Main Topics  . Smoking status: Never Smoker   . Smokeless tobacco: Not on file  . Alcohol Use: No  . Drug Use: No  . Sexually Active: Not on file   Other Topics Concern  . Not on file   Social History Narrative   Lives with brother   Used to work in Designer, fashion/clothing   Denies cigarettes, alcohol, other drugs  Review of Systems: General:denies sweating or flushing, denies increased thirst though drinking 2 quarts of water or more daily HEENT: hard of hearing CV: +palpitations intermittently, +chest tightness/pressure intermittently x months (worse today), denies orthopnea  Lungs:denies cough or cold like symptoms, +sob at rest and with exertion x 2 weeks   Abdomen/GU: denies dysuria, denies blood in stool or urine, +urinary incontinence (wears Poise pads) Extremities:denies swelling in extremities except knees due to arthritis  Neuro: denies dizziness or vertigo currently.  She states sometimes she has inner ear problems which she takes Meclizine. Denies syncope Psych: worsening depression where she does not want to leave the house be around others, +anhendonia symptoms, denies SI, +sleep problems MSK: pain under  b/l shoulder blades  Other: denies recent change in medications   Physical Exam: HR 57 on exam  Blood pressure 116/58, pulse 70, temperature 97.5 F (36.4 C), temperature source Oral, resp. rate 24, height 5\' 4"  (1.626 m), weight 185 lb 10 oz (84.2 kg), SpO2 97.00%. General:sitting on side of bed, nad, alert and oriented x 3  HEENT:Brea/at  CV: no reproducible chest pain, slightly bradycardic, +systolic ejection murmur on right sternal border Lungs:ctab Abdomen: soft, obese, ntnd, normal bs  Extremities:2+ edema b/l lower extremities, edema to b/l knees, Otherwise warm no cyanosis  Neuro: alert and oriented x 3, moving all 4 extremities   Lab results: Basic Metabolic Panel:  Recent Labs  82/95/62 1750 02/18/13 2143  NA 119*  --   K 3.9  --   CL 83*  --   CO2 24  --   GLUCOSE 102*  --   BUN 19  --   CREATININE 0.88  --   CALCIUM 9.5  --   MG  --  1.6   Liver Function Tests:  Recent Labs  02/18/13 2143  AST 17  ALT 11  ALKPHOS 64  BILITOT 0.5  PROT 6.2  ALBUMIN 3.3*   No results found for this basename: LIPASE, AMYLASE,  in the last 72 hours No results found for this basename: AMMONIA,  in the last 72 hours CBC:  Recent Labs  02/18/13 1750  WBC 8.1  NEUTROABS 6.2  HGB 15.3*  HCT 41.4  MCV 88.7  PLT 189   Cardiac Enzymes:  Recent Labs  02/18/13 1745 02/18/13 2138  TROPONINI <0.30 <0.30   Urinalysis: No results found for this basename: COLORURINE, APPERANCEUR, LABSPEC, PHURINE, GLUCOSEU, HGBUR, BILIRUBINUR, KETONESUR, PROTEINUR, UROBILINOGEN, NITRITE, LEUKOCYTESUR,  in the last 72 hours  Misc. Labs: UA Trend Cardiac enzymes   Imaging results:  Dg Chest 2 View  02/18/2013  *RADIOLOGY REPORT*  Clinical Data: Shortness of breath.  Palpitations.  CHEST - 2 VIEW  Comparison: 01/21/2008  Findings:  Normal heart size.  No pleural effusion or edema.  There is no airspace consolidation identified.  Moderate size hiatal hernia is noted.  IMPRESSION:  1.  No  acute cardiopulmonary abnormalities. 2.  Hiatal hernia.   Original Report Authenticated By: Signa Kell, M.D.     Other results: EKG: sinus brady to 40, normal axis and intervals, ST normal. 3rd degree AV block  Assessment & Plan by Problem: 79 y.o PMH HTN, syncope, arrhythmia, palpitations/bradycardia presents with intermittent SOB (rest and exertion) x 2 weeks , intermittent "heart fluttering"/palpitations, chest tightness/pressure intermittently x months though worsening (worse today) prompting her to call EMS.   1. Sinus Bradycardia -History of Bradycardia noted since 2004.  On admission HR to 37.  3rd degree heart block noted on EKG  -  Etiology needs to ruled out MI with complaints of chest pressure, increased vagal tone, drugs (she is not taking any BBs though she is taking a CCB Nifedipine).  There is no evidence of anemia or infection.  Her last echo 04/2004 with EF 65-75%.  Mild mitral annular calcification.  Mild aortic and tricuspid valvular regurgitation.  She has a history of diastolic dysfunction.  2004 cardiac cath with no significant CAD.  Pro BNP 947.2 -She does not have any lightheadedness, presyncope or syncope -Will obtain echo, coagulation studies, urine, trend enzymes, check magnesium (1.6), liver enzymes. Will check am cortisol.  Repeat EKG in the am -Will consult cardiology. Dr. Donnie Aho to see the patient  -Hold Nifedipine XL 60 mg qd which can cause bradycardia -Zoll pads to bedside   2. Palpitations  -This can be a side effect of Nifedipine which we are holding. She also has anxiety which may cause palpitations.    3. Asymptomatic Hyponatremia -She has been drinking excessive water intake.  She has been drinking 2 quarts daily.  This may be due to polydipsia. She is also taking diuretics (Maxide) at home which we are currently holding.  Glucose is not extremely elevated.  CXR negative.   -Etiology could also her hypervolemic hyponatremia from CHF given elevated proBNP.    -Will give Lasix 20 mg iv x 1  -Will trend BMET, tsh, serum and urine osmolality, urine lytes   4. Depression/Anxiety history  -Depression appears uncontrolled.  The patient states so herself.  This needs further evaluation outpatient.  If deemed necessary inpatient.    5. HTN history -BP 167/64 on admisison -will monitor VS.  Holding Nifedipine, Maxzide and Diovan currently,    6. History of GERD -Patient intermittently takes Nexium  7. Protein calorie Malnutrition -3.3   8. F/E/N -NS 100 cc/hr x 12 hours changed to NSL.  Consider fluid restriction -will monitor and replace electrolytes prn -NPO after midnight   9. DVT px  -Heparin  Dispo: Disposition is deferred at this time, awaiting improvement of current medical problems. Anticipated discharge in approximately 2-3 day(s).   The patient does have a current PCP Dr. Providence Lanius, therefore will be requiring OPC follow-up after discharge.   The patient does not have transportation limitations that hinder transportation to clinic appointments.  SignedAnnett Gula [phone] 02/18/2013, 11:13 PM

## 2013-02-18 NOTE — ED Notes (Signed)
Pt alert, NAD, calm, interactive, skin W&D, resps e/u, speaking in clear complete sentences, eating crackers, (denies: pain, sob, nv, dizziness or other sx), VSS, HR noted to be low, 34-55, SB on monitor, frequent artifact, family at [facility] x3.

## 2013-02-18 NOTE — ED Notes (Signed)
C/o intermittent SOB, "heart fluttering", decreased appetite x 3 days. Denies CP, cold, cough. Also reports worsening depression. Per EMS - HR 40's.

## 2013-02-18 NOTE — ED Notes (Addendum)
Reports intermittent episodes SOB, with heart flutterring & racing occurring at times while at rest. Denies fever, cold, cough, UTUI Sx's, CP.  Pt became teary eyed & stated no energy, worsening depression past few months. "I don't feel like doing anything, I don't want to see anybody, talk to anybody". Reports talked to PCP about it & was instructed to keep taking her xanax. Currently being treated for ear infection/inner ear. Presently denies SOB, palpitations. Resp e/u, no distress. No edema BLE

## 2013-02-18 NOTE — ED Notes (Signed)
Report called to Laban Emperor, RN on unit 2000, preparing to transport, admitting MD arrived at [facility].

## 2013-02-19 ENCOUNTER — Encounter (HOSPITAL_COMMUNITY)
Admission: EM | Disposition: A | Discharge status: Home Health | Payer: Self-pay | Source: Home / Self Care | Attending: Internal Medicine

## 2013-02-19 ENCOUNTER — Encounter (HOSPITAL_COMMUNITY): Payer: Self-pay | Admitting: Internal Medicine

## 2013-02-19 DIAGNOSIS — R0789 Other chest pain: Secondary | ICD-10-CM

## 2013-02-19 DIAGNOSIS — R079 Chest pain, unspecified: Secondary | ICD-10-CM

## 2013-02-19 DIAGNOSIS — E871 Hypo-osmolality and hyponatremia: Secondary | ICD-10-CM

## 2013-02-19 DIAGNOSIS — I442 Atrioventricular block, complete: Principal | ICD-10-CM

## 2013-02-19 DIAGNOSIS — I498 Other specified cardiac arrhythmias: Secondary | ICD-10-CM

## 2013-02-19 DIAGNOSIS — F411 Generalized anxiety disorder: Secondary | ICD-10-CM

## 2013-02-19 HISTORY — PX: PERMANENT PACEMAKER INSERTION: SHX5480

## 2013-02-19 LAB — URINALYSIS, ROUTINE W REFLEX MICROSCOPIC
Bilirubin Urine: NEGATIVE
Glucose, UA: NEGATIVE mg/dL
Hgb urine dipstick: NEGATIVE
Ketones, ur: NEGATIVE mg/dL
Leukocytes, UA: NEGATIVE
Nitrite: NEGATIVE
Protein, ur: NEGATIVE mg/dL
Specific Gravity, Urine: 1.009 (ref 1.005–1.030)
Urobilinogen, UA: 0.2 mg/dL (ref 0.0–1.0)
pH: 6 (ref 5.0–8.0)

## 2013-02-19 LAB — OSMOLALITY, URINE: Osmolality, Ur: 217 mosm/kg — ABNORMAL LOW (ref 390–1090)

## 2013-02-19 LAB — NA AND K (SODIUM & POTASSIUM), RAND UR
Potassium Urine: 34 meq/L
Sodium, Ur: 14 meq/L

## 2013-02-19 LAB — CBC
HCT: 43.3 % (ref 36.0–46.0)
Hemoglobin: 16 g/dL — ABNORMAL HIGH (ref 12.0–15.0)
MCH: 33.2 pg (ref 26.0–34.0)
MCHC: 37 g/dL — ABNORMAL HIGH (ref 30.0–36.0)
MCV: 89.8 fL (ref 78.0–100.0)
Platelets: 209 K/uL (ref 150–400)
RBC: 4.82 MIL/uL (ref 3.87–5.11)
RDW: 11.8 % (ref 11.5–15.5)
WBC: 8.3 K/uL (ref 4.0–10.5)

## 2013-02-19 LAB — BASIC METABOLIC PANEL
CO2: 26 mEq/L (ref 19–32)
Calcium: 8.8 mg/dL (ref 8.4–10.5)
GFR calc non Af Amer: 77 mL/min — ABNORMAL LOW (ref >90)
Sodium: 128 mEq/L — ABNORMAL LOW (ref 135–145)

## 2013-02-19 LAB — BASIC METABOLIC PANEL WITH GFR
BUN: 17 mg/dL (ref 6–23)
CO2: 29 meq/L (ref 19–32)
Calcium: 8.6 mg/dL (ref 8.4–10.5)
Chloride: 85 meq/L — ABNORMAL LOW (ref 96–112)
Creatinine, Ser: 0.87 mg/dL (ref 0.50–1.10)
GFR calc Af Amer: 70 mL/min — ABNORMAL LOW (ref >90)
GFR calc non Af Amer: 60 mL/min — ABNORMAL LOW (ref >90)
Glucose, Bld: 100 mg/dL — ABNORMAL HIGH (ref 70–99)
Potassium: 2.7 meq/L — CL (ref 3.5–5.1)
Sodium: 124 meq/L — ABNORMAL LOW (ref 135–145)

## 2013-02-19 LAB — OSMOLALITY: Osmolality: 259 mOsm/kg — ABNORMAL LOW (ref 275–300)

## 2013-02-19 LAB — CREATININE, URINE, RANDOM: Creatinine, Urine: 26.48 mg/dL

## 2013-02-19 SURGERY — PERMANENT PACEMAKER INSERTION
Anesthesia: LOCAL

## 2013-02-19 MED ORDER — ALUM & MAG HYDROXIDE-SIMETH 200-200-20 MG/5ML PO SUSP
30.0000 mL | ORAL | Status: DC | PRN
  Administered 2013-02-19 – 2013-02-20 (×2): 30 mL via ORAL
  Filled 2013-02-19 (×2): qty 30

## 2013-02-19 MED ORDER — CEFAZOLIN SODIUM-DEXTROSE 2-3 GM-% IV SOLR
2.0000 g | Freq: Four times a day (QID) | INTRAVENOUS | Status: AC
  Administered 2013-02-19 – 2013-02-20 (×3): 2 g via INTRAVENOUS
  Filled 2013-02-19 (×3): qty 50

## 2013-02-19 MED ORDER — POTASSIUM CHLORIDE 10 MEQ/100ML IV SOLN: 10.0000 meq | INTRAVENOUS | Status: DC

## 2013-02-19 MED ORDER — CHLORHEXIDINE GLUCONATE 4 % EX LIQD
60.0000 mL | Freq: Once | CUTANEOUS | Status: AC
  Administered 2013-02-19: 4 via TOPICAL
  Filled 2013-02-19: qty 60

## 2013-02-19 MED ORDER — SODIUM CHLORIDE 0.9 % IV SOLN: INTRAVENOUS | Status: DC

## 2013-02-19 MED ORDER — POTASSIUM CHLORIDE CRYS ER 20 MEQ PO TBCR
40.0000 meq | EXTENDED_RELEASE_TABLET | Freq: Three times a day (TID) | ORAL | Status: DC
  Administered 2013-02-19 – 2013-02-20 (×3): 40 meq via ORAL
  Filled 2013-02-19 (×5): qty 2

## 2013-02-19 MED ORDER — POTASSIUM CHLORIDE CRYS ER 20 MEQ PO TBCR
40.0000 meq | EXTENDED_RELEASE_TABLET | Freq: Two times a day (BID) | ORAL | Status: DC
  Administered 2013-02-19: 40 meq via ORAL
  Filled 2013-02-19: qty 2

## 2013-02-19 MED ORDER — SODIUM CHLORIDE 0.9 % IR SOLN
80.0000 mg | Status: DC
  Filled 2013-02-19: qty 2

## 2013-02-19 MED ORDER — CEFAZOLIN SODIUM-DEXTROSE 2-3 GM-% IV SOLR
2.0000 g | Freq: Four times a day (QID) | INTRAVENOUS | Status: DC
  Filled 2013-02-19 (×3): qty 50

## 2013-02-19 MED ORDER — POTASSIUM CHLORIDE CRYS ER 20 MEQ PO TBCR
40.0000 meq | EXTENDED_RELEASE_TABLET | Freq: Four times a day (QID) | ORAL | Status: DC
  Administered 2013-02-19: 40 meq via ORAL
  Filled 2013-02-19: qty 2

## 2013-02-19 MED ORDER — FENTANYL CITRATE 0.05 MG/ML IJ SOLN
INTRAMUSCULAR | Status: AC
  Filled 2013-02-19: qty 2

## 2013-02-19 MED ORDER — CEFAZOLIN SODIUM-DEXTROSE 2-3 GM-% IV SOLR
2.0000 g | INTRAVENOUS | Status: DC
  Filled 2013-02-19: qty 50

## 2013-02-19 MED ORDER — LIDOCAINE HCL (PF) 1 % IJ SOLN
INTRAMUSCULAR | Status: AC
  Filled 2013-02-19: qty 60

## 2013-02-19 MED ORDER — POTASSIUM CHLORIDE CRYS ER 20 MEQ PO TBCR: 40.0000 meq | EXTENDED_RELEASE_TABLET | Freq: Two times a day (BID) | ORAL | Status: DC

## 2013-02-19 MED ORDER — CHLORHEXIDINE GLUCONATE 4 % EX LIQD: 60.0000 mL | Freq: Once | CUTANEOUS | Status: DC

## 2013-02-19 MED ORDER — ACETAMINOPHEN 325 MG PO TABS
325.0000 mg | ORAL_TABLET | ORAL | Status: DC | PRN
  Administered 2013-02-19 – 2013-02-20 (×3): 650 mg via ORAL
  Filled 2013-02-19 (×3): qty 2

## 2013-02-19 MED ORDER — ONDANSETRON HCL 4 MG/2ML IJ SOLN: 4.0000 mg | Freq: Four times a day (QID) | INTRAMUSCULAR | Status: DC | PRN

## 2013-02-19 MED ORDER — MIDAZOLAM HCL 5 MG/5ML IJ SOLN
INTRAMUSCULAR | Status: AC
  Filled 2013-02-19: qty 5

## 2013-02-19 NOTE — Progress Notes (Signed)
PT Cancellation Note  Patient Details Name: Tonya Maddox MRN: [id number] DOB: 14-Aug-1928   Cancelled Treatment:    Reason Eval/Treat Not Completed: Patient not medically ready. Pt going for pacemaker insertion this afternoon. RN asked to hold at this time. PT to return as able.   Marcene Brawn 02/19/2013, 12:23 PM

## 2013-02-19 NOTE — H&P (View-Only) (Signed)
 ELECTROPHYSIOLOGY CONSULT NOTE  Patient ID: Tonya Maddox MRN: [id number], DOB/AGE: 06/12/1928   Admit date: 02/18/2013 Date of Consult: 02/19/2013  Primary Cardiologist: Tilley, MD Reason for Consultation: Bradycardia  History of Present Illness Tonya Maddox is a pleasant 79 year old woman with HTN and depression who has been admitted with CP and SOB, found to have high grade AV block. Tonya Maddox reports intermittent chest "tightness" and SOB x 1 month. Her symptoms significantly worsened yesterday prompting her presentation to the ED. She also reports "no energy" for several months which she attributed to her depression. She has also had intermittent dizziness where she feels "swimmy-headed" for a few minutes at a time. She denies palpitations or syncope. She denies any history of CAD/MI, valvular heart disease or HF. She denies history of thyroid dysfunction or Lyme disease. On presentation, her 12-lead ECG shows 2:1 AV block at 40 bpm. At times she will transition to complete heart block with an escape of 30/min. CEs are negative. Of note, she reports h/o bradycardia 10 years ago at which time she was taken off of atenolol. She is not taking any other AV nodal/rate controlling medications.  Past Medical History Past Medical History  Diagnosis Date  . Depression   . Hypertension   . Vertigo   . GERD (gastroesophageal reflux disease)   . Arrhythmia   . Anxiety   . Syncope     2005  . Bradycardia     2004  . Arthritis     knees and right hip  . Hard of hearing   . Obesity (BMI 30-39.9)     Past Surgical History Past Surgical History  Procedure Laterality Date  . Knee arthroscopy    . Cholecystectomy    . Appendectomy       Allergies/Intolerances No Known Allergies  Inpatient Medications . heparin  5,000 Units Subcutaneous Q8H  . potassium chloride  40 mEq Oral Q6H  . sodium chloride  3 mL Intravenous Q12H    Family History Positive for CAD   Social History Social  History  . Marital Status: Single   Social History Main Topics  . Smoking status: Never Smoker   . Smokeless tobacco: Never Used  . Alcohol Use: No  . Drug Use: No   Social History Narrative   Lives with brother   Used to work in textiles   Denies cigarettes, alcohol, other drugs    Review of Systems General: No chills, fever, night sweats or weight changes  Cardiovascular: +CP +SOB +fatigue  No edema, orthopnea, palpitations, paroxysmal nocturnal dyspnea Dermatological: No rash, lesions or masses Respiratory: No cough Urologic: No hematuria, dysuria Abdominal: No nausea, vomiting, diarrhea, bright red blood per rectum, melena, or hematemesis Neurologic: No visual changes, weakness, changes in mental status All other systems reviewed and are otherwise negative except as noted above.  Physical Exam Blood pressure 141/40, pulse 43, temperature 98.3 F (36.8 C), temperature source Oral, resp. rate 15, height 5' 4" (1.626 m), weight 182 lb 8.7 oz (82.8 kg), SpO2 96.00%.  General: Well developed, well appearing 79 year year old female in no acute distress. HEENT: Normocephalic, atraumatic. EOMs intact. Sclera nonicteric. Oropharynx clear.  Neck: Supple without bruits. No JVD. Lungs: Respirations regular and unlabored, CTA bilaterally. No wheezes, rales or rhonchi. Heart: RRR. S1, S2 present. No murmurs, rub, S3 or S4. Abdomen: Soft, non-tender, non-distended. BS present x 4 quadrants. No hepatosplenomegaly.  Extremities: No clubbing, cyanosis or edema. DP/PT/Radials 2+ and equal bilaterally. Psych: Normal   affect. Neuro: Alert and oriented X 3. Moves all extremities spontaneously. Musculoskeletal: No kyphosis. Skin: Intact. Warm and dry. No rashes or petechiae in exposed areas.   Labs  Recent Labs  02/18/13 1745 02/18/13 2138 02/19/13 0325 02/19/13 0908  TROPONINI <0.30 <0.30 <0.30 <0.30   Lab Results  Component Value Date   WBC 8.3 02/19/2013   HGB 16.0* 02/19/2013   HCT 43.3  02/19/2013   MCV 89.8 02/19/2013   PLT 209 02/19/2013    Recent Labs Lab 02/18/13 2143 02/19/13 0319  NA  --  124*  K  --  2.7*  CL  --  85*  CO2  --  29  BUN  --  17  CREATININE  --  0.87  CALCIUM  --  8.6  PROT 6.2  --   BILITOT 0.5  --   ALKPHOS 64  --   ALT 11  --   AST 17  --   GLUCOSE  --  100*   Lab Results  Component Value Date   CHOL 117 02/18/2013   HDL 56 02/18/2013   LDLCALC 55 02/18/2013   TRIG 31 02/18/2013   No results found for this basename: TSH, T4TOTAL, FREET3, T3FREE, THYROIDAB,  in the last 72 hours  Recent Labs  02/18/13 2143  INR 1.01    Radiology/Studies Dg Chest 2 View  02/18/2013  *RADIOLOGY REPORT*   Findings:  Normal heart size.  No pleural effusion or edema.  There is no airspace consolidation identified.  Moderate size hiatal hernia is noted.   IMPRESSION:  1.  No acute cardiopulmonary abnormalities. 2.  Hiatal hernia.      Echocardiogram  pending   12-lead ECG on admission shows 2:1 AV block at 40 bpm; narrow QRS - 100 msec Telemetry reviewed and shows sinus bradycardia, intermittent 2:1 AV block and junctional bradycardia overnight   Assessment and Plan 1. Symptomatic bradycardia, 2:1 AV block, intermittent CHB, and junctional bradycardia Tonya Maddox presents with 79 2:1 AV block, symptomatic with CP, SOB and fatigue. She is not taking any AV nodal/rate controlling medications. There have been no reversible causes identified. She meets criteria for PPM implantation. Risks, benefits and alternatives to PPM implantation were discussed in detail with Tonya Maddox today. These risks include, but are not limited to, bleeding, infection, pneumothorax, perforation, tamponade, vascular damage, renal failure, MI, lead dislodgement, stroke and death. She expressed verbal understanding and agrees to proceed.   Signed, EDMISTEN, BROOKE, PA-C 02/19/2013, 11:07 AM  EP Attending  Patient seen and examined. Agree with above exam, assessment and plan. For PPM later  today. Tonya Maddox,M.D.  

## 2013-02-19 NOTE — Op Note (Signed)
DDD PPM insertion via the left subclavian vein without immediate complication. [id number].

## 2013-02-19 NOTE — Care Management Note (Addendum)
    Page 1 of 2   02/20/2013     1:33:18 PM   CARE MANAGEMENT NOTE 02/20/2013  Patient:  Tonya Maddox, Tonya Maddox   Account Number:  [id number]  Date Initiated:  02/19/2013  Documentation initiated by:  Tonya Maddox  Subjective/Objective Assessment:   adm w brady, 3rd deg heart block     Action/Plan:   lives w fam   Anticipated DC Date:  02/20/2013   Anticipated DC Plan:  HOME W HOME HEALTH SERVICES      DC Planning Services  CM consult      [facility] Choice  HOME HEALTH   Choice offered to / List presented to:  C-1 Patient   DME arranged  CANE      DME agency  [facility]     HH arranged  HH-2 PT      [facility] agency  [facility]   Status of service:  Completed, signed off Medicare Important Message given?   (If response is "NO", the following Medicare IM given date fields will be blank) Date Medicare IM given:   Date Additional Medicare IM given:    Discharge Disposition:  HOME W HOME HEALTH SERVICES  Per UR Regulation:  Reviewed for med. necessity/level of care/duration of stay  If discussed at Long Length of Stay Meetings, dates discussed:    Comments:  02/20/13 Tonya Kreiter,RN,BSN [phone] PT FOR DC HOME TODAY.  PLANS TO DC TO S[facility] [address] FOR A FEW DAYS FIRST.  ADDRESS IS [address], [place], [place]  PHONE, [phone].  REFERRAL TO [facility] FOR HH FOLLOW UP.  REQUESTS CANE FOR HOME USE.  WILL REFER TO [facility] FOR DME NEEDS.  PT GIVEN HEALTH CONNECT PHONE NUMBER TO ASSIST WITH ESTABLISHING PRIMARY CARE MD.  START OF CARE FOR HH 24-48H POST DC DATE.  4/7 1147 Tonya dowell rn,bsn

## 2013-02-19 NOTE — Consult Note (Signed)
Reason for Consult: Dizzy spells x 3 weeks  Referring Physician: Dr. Yolonda Kida Tonya Maddox is an 79 y.o. female.   HPI: 79 y/o female currently seen in consultation with Dr. Kem Kays for evaluation of dizzy spells. She states that her symptoms initially started about 3 to 4 months ago, but has gotten worse over the last 3 weeks.  Her dizzy spells occur at rest and with exertion and are sometimes associated with shortness of breath and chest pain that she describes as a sharp twinge that last a few seconds before spontaneous relief. She denies any syncope but does admits to having occasional palpitation.  Her EKG from 02/18/2013 at 17:02 shows 3rd degree AV block with a ventricular rate of 40 bpm. She is not on any AV nodal blocking agents.  Prior cardiac evaluation by Dr. Donnie Aho include a transthoracic echocardiogram 05/04/2004 which showed LVEF 65-75%.  Cardiac catheterization from 11/29/2002 showed no angiographically significant CAD.  Past Medical History  Diagnosis Date  . Depression   . Hypertension   . Vertigo   . GERD (gastroesophageal reflux disease)   . Arrhythmia   . Anxiety   . Syncope     2005  . Palpitations     2005  . Bradycardia     2004  . Arthritis     knees and right hip    Past Surgical History  Procedure Laterality Date  . Knee arthroscopy    . Cholecystectomy    . Appendectomy    . Other surgical history      partial menisectomy     Family History  Problem Relation Age of Onset  . Heart Problems      died at 11    Social History:  reports that she has never smoked. She does not have any smokeless tobacco history on file. She reports that she does not drink alcohol or use illicit drugs.  Allergies: No Known Allergies  Medications:   Nexium 40 mg qd Xanax 1 mg qd Nifedipine XL 60 mg qd Maxzide 37.5/25 mg qd Diovan 320 mg qd  Results for orders placed during the hospital encounter of 02/18/13 (from the past 48 hour(s))  TROPONIN I     Status: None    Collection Time    02/18/13  5:45 PM      Result Value Range   Troponin I <0.30  <0.30 ng/mL   Comment:            Due to the release kinetics of cTnI,     a negative result within the first hours     of the onset of symptoms does not rule out     myocardial infarction with certainty.     If myocardial infarction is still suspected,     repeat the test at appropriate intervals.  BASIC METABOLIC PANEL     Status: Abnormal   Collection Time    02/18/13  5:50 PM      Result Value Range   Sodium 119 (*) 135 - 145 mEq/L   Comment: CRITICAL RESULT CALLED TO, READ BACK BY AND VERIFIED WITH:     YOUNG,S RN 02/18/13 1847 WOOTEN,K   Potassium 3.9  3.5 - 5.1 mEq/L   Comment: HEMOLYSIS AT THIS LEVEL MAY AFFECT RESULT   Chloride 83 (*) 96 - 112 mEq/L   CO2 24  19 - 32 mEq/L   Glucose, Bld 102 (*) 70 - 99 mg/dL   BUN 19  6 - 23  mg/dL   Creatinine, Ser 6.04  0.50 - 1.10 mg/dL   Calcium 9.5  8.4 - 54.0 mg/dL   GFR calc non Af Amer 60 (*) >90 mL/min   GFR calc Af Amer 69 (*) >90 mL/min   Comment:            The eGFR has been calculated     using the CKD EPI equation.     This calculation has not been     validated in all clinical     situations.     eGFR's persistently     <90 mL/min signify     possible Chronic Kidney Disease.  CBC WITH DIFFERENTIAL     Status: Abnormal   Collection Time    02/18/13  5:50 PM      Result Value Range   WBC 8.1  4.0 - 10.5 K/uL   RBC 4.67  3.87 - 5.11 MIL/uL   Hemoglobin 15.3 (*) 12.0 - 15.0 g/dL   HCT 98.1  19.1 - 47.8 %   MCV 88.7  78.0 - 100.0 fL   MCH 32.8  26.0 - 34.0 pg   MCHC 37.0 (*) 30.0 - 36.0 g/dL   RDW 29.5  62.1 - 30.8 %   Platelets 189  150 - 400 K/uL   Neutrophils Relative 77  43 - 77 %   Neutro Abs 6.2  1.7 - 7.7 K/uL   Lymphocytes Relative 15  12 - 46 %   Lymphs Abs 1.2  0.7 - 4.0 K/uL   Monocytes Relative 7  3 - 12 %   Monocytes Absolute 0.6  0.1 - 1.0 K/uL   Eosinophils Relative 0  0 - 5 %   Eosinophils Absolute 0.0  0.0 - 0.7  K/uL   Basophils Relative 1  0 - 1 %   Basophils Absolute 0.0  0.0 - 0.1 K/uL  TROPONIN I     Status: None   Collection Time    02/18/13  9:38 PM      Result Value Range   Troponin I <0.30  <0.30 ng/mL   Comment:            Due to the release kinetics of cTnI,     a negative result within the first hours     of the onset of symptoms does not rule out     myocardial infarction with certainty.     If myocardial infarction is still suspected,     repeat the test at appropriate intervals.  PRO B NATRIURETIC PEPTIDE     Status: Abnormal   Collection Time    02/18/13  9:38 PM      Result Value Range   Pro B Natriuretic peptide (BNP) 947.2 (*) 0 - 450 pg/mL  LIPID PANEL     Status: None   Collection Time    02/18/13  9:39 PM      Result Value Range   Cholesterol 117  0 - 200 mg/dL   Triglycerides 31  <657 mg/dL   HDL 56  >84 mg/dL   Total CHOL/HDL Ratio 2.1     VLDL 6  0 - 40 mg/dL   LDL Cholesterol 55  0 - 99 mg/dL   Comment:            Total Cholesterol/HDL:CHD Risk     Coronary Heart Disease Risk Table  Men   Women      1/2 Average Risk   3.4   3.3      Average Risk       5.0   4.4      2 X Average Risk   9.6   7.1      3 X Average Risk  23.4   11.0                Use the calculated Patient Ratio     above and the CHD Risk Table     to determine the patient's CHD Risk.                ATP III CLASSIFICATION (LDL):      <100     mg/dL   Optimal      454-098  mg/dL   Near or Above                        Optimal      130-159  mg/dL   Borderline      119-147  mg/dL   High      >829     mg/dL   Very High  APTT     Status: None   Collection Time    02/18/13  9:43 PM      Result Value Range   aPTT 32  24 - 37 seconds  PROTIME-INR     Status: None   Collection Time    02/18/13  9:43 PM      Result Value Range   Prothrombin Time 13.2  11.6 - 15.2 seconds   INR 1.01  0.00 - 1.49  MAGNESIUM     Status: None   Collection Time    02/18/13  9:43 PM       Result Value Range   Magnesium 1.6  1.5 - 2.5 mg/dL  HEPATIC FUNCTION PANEL     Status: Abnormal   Collection Time    02/18/13  9:43 PM      Result Value Range   Total Protein 6.2  6.0 - 8.3 g/dL   Albumin 3.3 (*) 3.5 - 5.2 g/dL   AST 17  0 - 37 U/L   ALT 11  0 - 35 U/L   Alkaline Phosphatase 64  39 - 117 U/L   Total Bilirubin 0.5  0.3 - 1.2 mg/dL   Bilirubin, Direct 0.1  0.0 - 0.3 mg/dL   Indirect Bilirubin 0.4  0.3 - 0.9 mg/dL  URINALYSIS, ROUTINE W REFLEX MICROSCOPIC     Status: None   Collection Time    02/18/13 11:52 PM      Result Value Range   Color, Urine YELLOW  YELLOW   APPearance CLEAR  CLEAR   Specific Gravity, Urine 1.009  1.005 - 1.030   pH 6.0  5.0 - 8.0   Glucose, UA NEGATIVE  NEGATIVE mg/dL   Hgb urine dipstick NEGATIVE  NEGATIVE   Bilirubin Urine NEGATIVE  NEGATIVE   Ketones, ur NEGATIVE  NEGATIVE mg/dL   Protein, ur NEGATIVE  NEGATIVE mg/dL   Urobilinogen, UA 0.2  0.0 - 1.0 mg/dL   Nitrite NEGATIVE  NEGATIVE   Leukocytes, UA NEGATIVE  NEGATIVE   Comment: MICROSCOPIC NOT DONE ON URINES WITH NEGATIVE PROTEIN, BLOOD, LEUKOCYTES, NITRITE, OR GLUCOSE <1000 mg/dL.    Dg Chest 2 View  02/18/2013  *RADIOLOGY REPORT*  Clinical Data: Shortness of breath.  Palpitations.  CHEST - 2  VIEW  Comparison: 01/21/2008  Findings:  Normal heart size.  No pleural effusion or edema.  There is no airspace consolidation identified.  Moderate size hiatal hernia is noted.  IMPRESSION:  1.  No acute cardiopulmonary abnormalities. 2.  Hiatal hernia.   Original Report Authenticated By: Signa Kell, M.D.     Review of Systems  Constitutional: Positive for malaise/fatigue. Negative for fever, chills, weight loss and diaphoresis.  HENT: Negative for hearing loss, ear pain, nosebleeds, congestion, sore throat, neck pain, tinnitus and ear discharge.   Eyes: Negative for blurred vision, double vision, photophobia, pain, discharge and redness.  Respiratory: Positive for shortness of  breath. Negative for hemoptysis, sputum production, wheezing and stridor.   Cardiovascular: Positive for chest pain, palpitations and leg swelling. Negative for orthopnea, claudication and PND.  Gastrointestinal: Negative for heartburn, nausea, vomiting, abdominal pain, diarrhea, constipation, blood in stool and melena.  Genitourinary: Negative for dysuria, urgency, frequency, hematuria and flank pain.  Musculoskeletal: Negative for myalgias and back pain.  Skin: Negative for itching and rash.  Neurological: Positive for dizziness and weakness. Negative for tingling, tremors, sensory change, speech change, focal weakness, seizures, loss of consciousness and headaches.  Psychiatric/Behavioral: Positive for depression. Negative for hallucinations.   Blood pressure 127/90, pulse 54, temperature 97.5 F (36.4 C), temperature source Oral, resp. rate 18, height 5\' 4"  (1.626 m), weight 84.2 kg (185 lb 10 oz), SpO2 99.00%. Physical Exam  Constitutional: She is oriented to person, place, and time. She appears well-developed and well-nourished. No distress.  HENT:  Head: Normocephalic and atraumatic.  Eyes: EOM are normal. Right eye exhibits no discharge. Left eye exhibits no discharge. No scleral icterus.  Neck: No JVD present. No tracheal deviation present.  Cardiovascular: Exam reveals no gallop and no friction rub.   Murmur heard. Bradycardic with regular rhythm  Respiratory: No respiratory distress. She has no wheezes. She has no rales. She exhibits no tenderness.  GI: She exhibits no distension. There is no tenderness. There is no rebound and no guarding.  Musculoskeletal: She exhibits edema. She exhibits no tenderness.  Neurological: She is alert and oriented to person, place, and time.  Skin: No rash noted. She is not diaphoretic. No erythema.    Assessment/Plan:   1. Symptomatic 3rd degree AV block:  Patient is currently not on any AV nodal block agents; therefore, she likely has cardiac  fibrosis that affected her conduction system. We will place Zoll Pads and transfer the patient to our ICU for closed monitoring. We will keep her NPO in consideration of placement of a permanent pacemaker.  2.  Chest pains: are atypical in nature.  It is unlikely the cause of her heart block.  We will obtain serial cardiac markers to rule out MI.  We will obtain a transthoracic echo in the morning to evaluate LVEF.   Raesean Bartoletti E 02/19/2013, 12:12 AM

## 2013-02-19 NOTE — Consult Note (Signed)
ELECTROPHYSIOLOGY CONSULT NOTE  Patient ID: Tonya Maddox MRN: [id number], DOB/AGE: 77/29/1929   Admit date: 02/18/2013 Date of Consult: 02/19/2013  Primary Cardiologist: Donnie Aho, MD Reason for Consultation: Bradycardia  History of Present Illness Tonya Maddox is a pleasant 79 year old woman with HTN and depression who has been admitted with CP and SOB, found to have high grade AV block. Tonya Maddox reports intermittent chest "tightness" and SOB x 1 month. Her symptoms significantly worsened yesterday prompting her presentation to the ED. She also reports "no energy" for several months which she attributed to her depression. She has also had intermittent dizziness where she feels "swimmy-headed" for a few minutes at a time. She denies palpitations or syncope. She denies any history of CAD/MI, valvular heart disease or HF. She denies history of thyroid dysfunction or Lyme disease. On presentation, her 12-lead ECG shows 2:1 AV block at 40 bpm. At times she will transition to complete heart block with an escape of 30/min. CEs are negative. Of note, she reports h/o bradycardia 10 years ago at which time she was taken off of atenolol. She is not taking any other AV nodal/rate controlling medications.  Past Medical History Past Medical History  Diagnosis Date  . Depression   . Hypertension   . Vertigo   . GERD (gastroesophageal reflux disease)   . Arrhythmia   . Anxiety   . Syncope     2005  . Bradycardia     2004  . Arthritis     knees and right hip  . Hard of hearing   . Obesity (BMI 30-39.9)     Past Surgical History Past Surgical History  Procedure Laterality Date  . Knee arthroscopy    . Cholecystectomy    . Appendectomy       Allergies/Intolerances No Known Allergies  Inpatient Medications . heparin  5,000 Units Subcutaneous Q8H  . potassium chloride  40 mEq Oral Q6H  . sodium chloride  3 mL Intravenous Q12H    Family History Positive for CAD   Social History Social  History  . Marital Status: Single   Social History Main Topics  . Smoking status: Never Smoker   . Smokeless tobacco: Never Used  . Alcohol Use: No  . Drug Use: No   Social History Narrative   Lives with brother   Used to work in Designer, fashion/clothing   Denies cigarettes, alcohol, other drugs    Review of Systems General: No chills, fever, night sweats or weight changes  Cardiovascular: +CP +SOB +fatigue  No edema, orthopnea, palpitations, paroxysmal nocturnal dyspnea Dermatological: No rash, lesions or masses Respiratory: No cough Urologic: No hematuria, dysuria Abdominal: No nausea, vomiting, diarrhea, bright red blood per rectum, melena, or hematemesis Neurologic: No visual changes, weakness, changes in mental status All other systems reviewed and are otherwise negative except as noted above.  Physical Exam Blood pressure 141/40, pulse 43, temperature 98.3 F (36.8 C), temperature source Oral, resp. rate 15, height 5\' 4"  (1.626 m), weight 182 lb 8.7 oz (82.8 kg), SpO2 96.00%.  General: Well developed, well appearing 79 year old female in no acute distress. HEENT: Normocephalic, atraumatic. EOMs intact. Sclera nonicteric. Oropharynx clear.  Neck: Supple without bruits. No JVD. Lungs: Respirations regular and unlabored, CTA bilaterally. No wheezes, rales or rhonchi. Heart: RRR. S1, S2 present. No murmurs, rub, S3 or S4. Abdomen: Soft, non-tender, non-distended. BS present x 4 quadrants. No hepatosplenomegaly.  Extremities: No clubbing, cyanosis or edema. DP/PT/Radials 2+ and equal bilaterally. Psych: Normal  affect. Neuro: Alert and oriented X 3. Moves all extremities spontaneously. Musculoskeletal: No kyphosis. Skin: Intact. Warm and dry. No rashes or petechiae in exposed areas.   Labs  Recent Labs  02/18/13 1745 02/18/13 2138 02/19/13 0325 02/19/13 0908  TROPONINI <0.30 <0.30 <0.30 <0.30   Lab Results  Component Value Date   WBC 8.3 02/19/2013   HGB 16.0* 02/19/2013   HCT 43.3  02/19/2013   MCV 89.8 02/19/2013   PLT 209 02/19/2013    Recent Labs Lab 02/18/13 2143 02/19/13 0319  NA  --  124*  K  --  2.7*  CL  --  85*  CO2  --  29  BUN  --  17  CREATININE  --  0.87  CALCIUM  --  8.6  PROT 6.2  --   BILITOT 0.5  --   ALKPHOS 64  --   ALT 11  --   AST 17  --   GLUCOSE  --  100*   Lab Results  Component Value Date   CHOL 117 02/18/2013   HDL 56 02/18/2013   LDLCALC 55 02/18/2013   TRIG 31 02/18/2013   No results found for this basename: TSH, T4TOTAL, FREET3, T3FREE, THYROIDAB,  in the last 72 hours  Recent Labs  02/18/13 2143  INR 1.01    Radiology/Studies Dg Chest 2 View  02/18/2013  *RADIOLOGY REPORT*   Findings:  Normal heart size.  No pleural effusion or edema.  There is no airspace consolidation identified.  Moderate size hiatal hernia is noted.   IMPRESSION:  1.  No acute cardiopulmonary abnormalities. 2.  Hiatal hernia.      Echocardiogram  pending   12-lead ECG on admission shows 2:1 AV block at 40 bpm; narrow QRS - 100 msec Telemetry reviewed and shows sinus bradycardia, intermittent 2:1 AV block and junctional bradycardia overnight   Assessment and Plan 1. Symptomatic bradycardia, 2:1 AV block, intermittent CHB, and junctional bradycardia Tonya Maddox presents with 2:1 AV block, symptomatic with CP, SOB and fatigue. She is not taking any AV nodal/rate controlling medications. There have been no reversible causes identified. She meets criteria for PPM implantation. Risks, benefits and alternatives to PPM implantation were discussed in detail with Tonya Maddox today. These risks include, but are not limited to, bleeding, infection, pneumothorax, perforation, tamponade, vascular damage, renal failure, MI, lead dislodgement, stroke and death. She expressed verbal understanding and agrees to proceed.   Signed, Rick Duff, PA-C 02/19/2013, 11:07 AM  EP Attending  Patient seen and examined. Agree with above exam, assessment and plan. For PPM later  today. Leonia Reeves.D.

## 2013-02-19 NOTE — Progress Notes (Signed)
Echocardiogram 2D Echocardiogram has been performed.  Mekisha Bittel 02/19/2013, 10:15 AM

## 2013-02-19 NOTE — Progress Notes (Signed)
Medical Student Daily Progress Note   Subjective:    Interval Events:  No acute events overnight.  She does not complain of chest pain, shortness of breath or dizziness today.  Does complain of some anxiety of getting news this morning that she has a complete heart block.    Objective:    Vital Signs:   Temp:  [97.5 F (36.4 C)-98.3 F (36.8 C)]     98.3 F (36.8 C) (04/07  Pulse Rate:  [31-103]        40 (04/07 0800) Resp:  [10-24]        12 (04/07 0800) BP: (100-167)/(34-110)       113/81 mmHg (04/07 0800) SpO2:  [97 %-100 %]        97 % (04/07 0800) Weight:  [82.8 kg (182 lb 8.7 oz)-85.3 kg (188 lb 0.8 oz)]       82.8 kg (182 lb 8.7 oz) (04/07 0700) Last BM Date: 02/17/13   Weights: 24-hour Weight change:   Filed Weights   02/18/13 2011 02/19/13 0134 02/19/13 0700  Weight: 84.2 kg (185 lb 10 oz) 85.3 kg (188 lb 0.8 oz) 82.8 kg (182 lb 8.7 oz)   Net since admission:  - 3 lb 2 ox   Intake/Output:   Intake/Output Summary (Last 24 hours) at 02/19/13 0902 Last data filed at 02/19/13 0700  Gross per 24 hour  Intake 863.33 ml  Output   3101 ml  Net -2237.67 ml     Net since admission:  - 2237 mL   Physical Exam: GENERAL: well developed, well nourished; no acute distress HEAD: atraumatic, normocephalic EYES:sclera anicteric MOUTH/THROAT: oropharynx clear, mildly dry mucous membranes NECK: supple, no JVD seen LUNGS: clear to auscultation bilaterally, normal work of breathing HEART: bradycardic to 40 bpm this AM and as low as 31 bpm overnight.  Regular rhythm; normal S1 and S2 with systolic ejection murmur. EXTREMITIES: very mild 1+ peripheral edema, no clubbing, or cyanosis   Labs: Basic Metabolic Panel:  Recent Labs Lab 02/18/13 1750 02/18/13 2143 02/19/13 0319  NA 119*  --  124*  K 3.9  --  2.7*  CL 83*  --  85*  CO2 24  --  29  GLUCOSE 102*  --  100*  BUN 19  --  17  CREATININE 0.88  --  0.87  CALCIUM 9.5  --  8.6  MG  --  1.6  --     Liver  Function Tests:  Recent Labs Lab 02/18/13 2143  AST 17  ALT 11  ALKPHOS 64  BILITOT 0.5  PROT 6.2  ALBUMIN 3.3*   CBC:  Recent Labs Lab 02/18/13 1750 02/19/13 0319  WBC 8.1 8.3  NEUTROABS 6.2  --   HGB 15.3* 16.0*  HCT 41.4 43.3  MCV 88.7 89.8  PLT 189 209    Cardiac Enzymes:  Recent Labs Lab 02/18/13 1745 02/18/13 2138 02/19/13 0325  TROPONINI <0.30 <0.30 <0.30    Coagulation Studies:  Recent Labs  02/18/13 2143  LABPROT 13.2  INR 1.01    Microbiology: Results for orders placed during the hospital encounter of 02/18/13  MRSA PCR SCREENING     Status: None   Collection Time    02/19/13  1:32 AM      Result Value Range Status   MRSA by PCR NEGATIVE  NEGATIVE Final   Comment:            The GeneXpert MRSA Assay (FDA     approved  for NASAL specimens     only), is one component of a     comprehensive MRSA colonization     surveillance program. It is not     intended to diagnose MRSA     infection nor to guide or     monitor treatment for     MRSA infections.    Other results: EKG Results:  02/19/2013 6:51 AM Rate:  44 PR:   variable QRS:  84 QTc:  437 EKG: Sinus rhythm with 2nd degree Mobitz I Heart Block.    Medications:    Scheduled Medications: . heparin  5,000 Units Subcutaneous Q8H  . potassium chloride  40 mEq Oral Q6H  . sodium chloride  3 mL Intravenous Q12H    Assessment/ Plan:    Assessment & Plan by Problem:  79 yo F with PMH of HTN, syncope, arrhythmia, palpitations and bradycardia who presents with intermittent shortness of breath at rest and exertion for 2 weeks.  She has also had intermittent "heart fluttering", palpitations, and chest tightness/pressure intermittently for months.  1. Sinus Bradycardia - DDx for etiology includes increased vagal tone and drugs (she is not taking any nodal blocking agents).  History of Bradycardia noted since 2004. On admission HR was 37. There was a 3rd degree heart block noted on EKG done  yesterday and repeat EKG today showed sinus rhythm with 2nd Degree heart block Mobitz I.  We have ruled out MI with 3 negative troponins.   There is no evidence of anemia or infection. Her last echo was in 04/2004 with EF 65-75%. Also showed Mild mitral annular calcification, mild aortic and tricuspid valvular regurgitation. She has a history of diastolic dysfunction. Her cardiac cath in 2004 showed no significant CAD. Pro BNP 947.2.  Coagulation studies and LFTs returned within normal limits.  Plan:  -Hold Nifedipine XL 60 mg qd  -Zoll pads to bedside  -Cardiology consulted and determined she may need a permanent pacer, Dr. Ladona Ridgel will evaluate her today..   -TTE performed this morning, interpretation pending.  2. Palpitations - stable.  She has anxiety which may cause these palpitations.   3. Asymptomatic Hyponatremia - Etiology could likely be hypovolemic hyponatremia seeing as patient does not look dry on exam.  She is also on diuretics (HCTZ) at home which we are currently holding, which cardiology also recommends this.  Glucose is not elevated.  Given patients history of depression and water ingestion this could be due to "tea & toast" syndrome.  Plan: -Currently on Lasix 20 mg iv x 1, discontinuing this today given her being hypovolemic on exam and hyponatremia to 124. -After discussing with attending, will hold off on normal saline today to since she had already been corrected by 5 meq in less than 24hrs.   -Recheck BMET today around 6PM.  4. Hypokalemia - Her K today was 2.7.  She is currently asymptomatic.  Will replace this today with 2 runs IV.  5. Depression/Anxiety history  -Depression appears uncontrolled. The patient states so herself. This needs further evaluation outpatient. If deemed necessary inpatient. Today, she was in good spirits and pleasant.  Plan to continue current regimen of Xanax.  6. HTN history - improved. -BP 113/81 this AM.  -Will monitor VS.  -Holding  Nifedipine and Maxzide, will continue only on Diovan currently.  7. History of GERD  -No complaints this AM. -Patient intermittently takes Nexium as needed.   8. F/E/N  -Will continue to monitor and replace electrolytes as needed.  -  NPO since midnight for possible Pacemaker today.  9. DVT px  -Heparin   Dispo: Disposition is deferred at this time, awaiting improvement of current medical problems. Anticipated discharge in approximately 2-3 day(s).  The patient does have a current PCP Dr. Providence Lanius, therefore will be requiring OPC follow-up after discharge.  The patient does not have transportation limitations that hinder transportation to clinic appointments.    Length of Stay: 1 days   This is a Psychologist, occupational Note.  The care of the patient was discussed with Dr. Heloise Beecham and the assessment and plan formulated with their assistance.  Please see their attached note or addendum for official documentation of the daily encounter.   Resident Co-sign Daily Note: I have seen the patient and reviewed the daily progress note by [facility] and discussed the care of the patient with them.  See below for documentation of my findings, assessment, and plans.  Subjective: Sitting in bed. Says she talked to cardiologist, is nervous about procedure but happy with all of the care she is receiving. No active CP, SOB, dizziness, N/V.   Objective: Vital signs in last 24 hours: Filed Vitals:   02/19/13 0900 02/19/13 1000 02/19/13 1100 02/19/13 1200  BP: 134/52 141/40 134/70 130/94  Pulse: 40 43 46 51  Temp:    98.3 F (36.8 C)  TempSrc:    Oral  Resp: 12 15 15 16   Height:      Weight:      SpO2: 99% 96% 96% 100%   Physical Exam: Vitals reviewed. General: resting in bed, NAD HEENT: PERRL, EOMI, no scleral icterus Cardiac: HR 40s w reg rhythm, systolic ejection murmur RUSB Pulm: clear to auscultation bilaterally, no wheezes, rales, or rhonchi Abd: soft, nontender, nondistended, BS  present Ext: warm and well perfused, no pedal edema Neuro: alert and oriented X3, cranial nerves II-XII grossly intact, strength and sensation to light touch equal in bilateral upper and lower extremities  Lab Results: Reviewed and documented in Electronic Record Micro Results: Reviewed and documented in Electronic Record Studies/Results: Reviewed and documented in Electronic Record  Medications: I have reviewed the patient's current medications. Scheduled Meds: .  ceFAZolin (ANCEF) IV  2 g Intravenous On Call  . chlorhexidine  60 mL Topical Once  . gentamicin irrigation  80 mg Irrigation On Call  . heparin  5,000 Units Subcutaneous Q8H  . potassium chloride  40 mEq Oral TID  . sodium chloride  3 mL Intravenous Q12H   Continuous Infusions: . sodium chloride     PRN Meds:. Assessment/Plan  1. Sx high degree AV block -  Pads on. No sx currently. Seen by cardiology, EP. Plan for PPM this afternoon  2. Palpitations - stable.  She has anxiety which may cause these palpitations. Possibly related to arrhythmia as above  3. Asymptomatic Hyponatremia  Etiology could likely be hypovolemic hyponatremia seeing as patient does not look volume overloaded on exam.  Also on diuretics at home, holding now.  Given patients history of depression and water ingestion this could be due to "tea & toast" syndrome. Na has corrected 5 mEq since admission (119-->124). Will not aim to correct chronic hyponatremia more than 6-8 mEq in first 24 hours.  Maintenance NS 50 cc/hr per cards for procedure.   4. Hypokalemia  Her K on admission was 2.7.  She is currently asymptomatic.  Replace w po TID, will recheck Bmet 1700 and replace prn.  5. Depression/Anxiety history  Depression appears uncontrolled. The patient  states so herself. This needs further evaluation outpatient. If deemed necessary inpatient. Today, she was in good spirits and pleasant. - lan to continue current regimen of Xanax.  6. HTN  history - improved. BP 113/81 this AM.  -Will monitor VS.  -Holding Nifedipine and Maxzide  7. History of GERD  No complaints this AM. -Patient intermittently takes Nexium as needed.   8. F/E/N  Will continue to monitor and replace electrolytes as needed.  -NPO since midnight for PPM today  9. DVT px  -Heparin      LOS: 1 day   Bronson Curb 02/19/2013, 1:03 PM

## 2013-02-19 NOTE — Progress Notes (Signed)
Subjective:  Patient is anxious, but no chest pain today.  She was significantly hypokalemic and hyponatremic yesterday. Having intermittent dizziness.  Objective:  Vital Signs in the last 24 hours: BP 113/81  Pulse 40  Temp(Src) 98.3 F (36.8 C) (Oral)  Resp 12  Ht 5\' 4"  (1.626 m)  Wt 82.8 kg (182 lb 8.7 oz)  BMI 31.32 kg/m2  SpO2 97%  Physical Exam: Anxious obese WF in NAD Lungs:  Clear  Cardiac:  irregular rhythm, normal S1 and S2, no S3 Abdomen:  Soft, nontender, no masses Extremities:  No edema present  Intake/Output from previous day: 04/06 0701 - 04/07 0700 In: 863.3 [I.V.:863.3] Out: 3101 [Urine:3101]  Weight Filed Weights   02/18/13 2011 02/19/13 0134 02/19/13 0700  Weight: 84.2 kg (185 lb 10 oz) 85.3 kg (188 lb 0.8 oz) 82.8 kg (182 lb 8.7 oz)    Lab Results: Basic Metabolic Panel:  Recent Labs  16/10/96 1750 02/19/13 0319  NA 119* 124*  K 3.9 2.7*  CL 83* 85*  CO2 24 29  GLUCOSE 102* 100*  BUN 19 17  CREATININE 0.88 0.87   CBC:  Recent Labs  02/18/13 1750 02/19/13 0319  WBC 8.1 8.3  NEUTROABS 6.2  --   HGB 15.3* 16.0*  HCT 41.4 43.3  MCV 88.7 89.8  PLT 189 209   Cardiac Enzymes:  Recent Labs  02/18/13 1745 02/18/13 2138 02/19/13 0325  TROPONINI <0.30 <0.30 <0.30    Telemetry: Sinus with PAC's, high degree AV block and intermittent complete heart block  Assessment/Plan:  1. Conduction system disease with intermittent complete heart block and high dregree AV block in the absence of AV nodal blocking agents 2. Severe hyponatremia 3. Hypertension  Rec:  Stop HCTZ.  Permanent pacer.  I have asked Dr. Ladona Ridgel to see. Check ECHO.      Darden Palmer  MD [facility]  02/19/2013, 8:59 AM

## 2013-02-19 NOTE — Interval H&P Note (Signed)
History and Physical Interval Note:  02/19/2013 1:59 PM  Tonya Maddox  has presented today for surgery, with the diagnosis of a  The various methods of treatment have been discussed with the patient and family. After consideration of risks, benefits and other options for treatment, the patient has consented to  Procedure(s): PERMANENT PACEMAKER INSERTION (N/A) as a surgical intervention .  The patient's history has been reviewed, patient examined, no change in status, stable for surgery.  I have reviewed the patient's chart and labs.  Questions were answered to the patient's satisfaction.     Lewayne Bunting

## 2013-02-19 NOTE — H&P (Signed)
INTERNAL MEDICINE TEACHING SERVICE Attending Admission Note  Date: 02/19/2013  Patient name: Tonya Maddox  Medical record number: [id number]  Date of birth: 04/04/1928    I have seen and evaluated Tonya Maddox and discussed their care with the Residency Team.  84 yr. Old female w/ PMHx significant for HTN, depression, GERD, anxiety disorder, hx syncopal episodes, dysrrythmias, presented with a 2 week history of dyspnea, palpitations.  She states she has been having intermittent chest pressure, not necessarily related to activity, that has worsened.  She denies orthopnea, PND.  She states she has felt more anxious and withdrawn lately and has been taking xanax as prescribed. On admission, she was noted to have a Na of 119, K of 3.9, Cl of 89, BUN 19, Cr 0.88.  Her H/H was noted to be 15/41.  Her proBNP was noted to be 947.  She has had three negative troponin I's since admission.   Posm was 259, Uosm 217, U SG 1.009. Repeat BMP this morning showed a Na of 124, K of 2.7, Cl of 85.   Her admission EKG had findings concerning for a second degree type 2 progressing to a 3rd degree AVB.    This morning she feels well. She does not endorse CP or SOB.  Physical Exam: Blood pressure 141/40, pulse 43, temperature 98.3 F (36.8 C), temperature source Oral, resp. rate 15, height 5\' 4"  (1.626 m), weight 182 lb 8.7 oz (82.8 kg), SpO2 96.00%.  General: Vital signs reviewed and noted. Well-developed, well-nourished, in no acute distress; alert, appropriate and cooperative throughout examination.  Head: Normocephalic, atraumatic.  Eyes: PERRL, EOMI, No signs of anemia or jaundince.  Nose: Mucous membranes moist, not inflammed, nonerythematous.  Throat: Oropharynx nonerythematous, no exudate appreciated.   Neck: No deformities, masses, or tenderness noted.Supple, No carotid Bruits, no JVD.  Lungs:  Normal respiratory effort. Clear to auscultation BL without crackles or wheezes.  Heart: Bradycardic, 3/6 SEM  RUSB, no JVD, no HJR. irreg.  Abdomen:  BS normoactive. Soft, Nondistended, non-tender.  No masses or organomegaly.  Extremities: Trace-1+ pretibial edema.  Neurologic: A&O X3, CN II - XII are grossly intact. Motor strength is 5/5 in the all 4 extremities, Sensations intact to light touch, Cerebellar signs negative.  Skin: No visible rashes, scars.    Lab results: Results for orders placed during the hospital encounter of 02/18/13 (from the past 24 hour(s))  TROPONIN I     Status: None   Collection Time    02/18/13  5:45 PM      Result Value Range   Troponin I <0.30  <0.30 ng/mL  BASIC METABOLIC PANEL     Status: Abnormal   Collection Time    02/18/13  5:50 PM      Result Value Range   Sodium 119 (*) 135 - 145 mEq/L   Potassium 3.9  3.5 - 5.1 mEq/L   Chloride 83 (*) 96 - 112 mEq/L   CO2 24  19 - 32 mEq/L   Glucose, Bld 102 (*) 70 - 99 mg/dL   BUN 19  6 - 23 mg/dL   Creatinine, Ser 4.09  0.50 - 1.10 mg/dL   Calcium 9.5  8.4 - 81.1 mg/dL   GFR calc non Af Amer 60 (*) >90 mL/min   GFR calc Af Amer 69 (*) >90 mL/min  CBC WITH DIFFERENTIAL     Status: Abnormal   Collection Time    02/18/13  5:50 PM      Result  Value Range   WBC 8.1  4.0 - 10.5 K/uL   RBC 4.67  3.87 - 5.11 MIL/uL   Hemoglobin 15.3 (*) 12.0 - 15.0 g/dL   HCT 40.9  81.1 - 91.4 %   MCV 88.7  78.0 - 100.0 fL   MCH 32.8  26.0 - 34.0 pg   MCHC 37.0 (*) 30.0 - 36.0 g/dL   RDW 78.2  95.6 - 21.3 %   Platelets 189  150 - 400 K/uL   Neutrophils Relative 77  43 - 77 %   Neutro Abs 6.2  1.7 - 7.7 K/uL   Lymphocytes Relative 15  12 - 46 %   Lymphs Abs 1.2  0.7 - 4.0 K/uL   Monocytes Relative 7  3 - 12 %   Monocytes Absolute 0.6  0.1 - 1.0 K/uL   Eosinophils Relative 0  0 - 5 %   Eosinophils Absolute 0.0  0.0 - 0.7 K/uL   Basophils Relative 1  0 - 1 %   Basophils Absolute 0.0  0.0 - 0.1 K/uL  TROPONIN I     Status: None   Collection Time    02/18/13  9:38 PM      Result Value Range   Troponin I <0.30  <0.30 ng/mL   PRO B NATRIURETIC PEPTIDE     Status: Abnormal   Collection Time    02/18/13  9:38 PM      Result Value Range   Pro B Natriuretic peptide (BNP) 947.2 (*) 0 - 450 pg/mL  LIPID PANEL     Status: None   Collection Time    02/18/13  9:39 PM      Result Value Range   Cholesterol 117  0 - 200 mg/dL   Triglycerides 31  <086 mg/dL   HDL 56  >57 mg/dL   Total CHOL/HDL Ratio 2.1     VLDL 6  0 - 40 mg/dL   LDL Cholesterol 55  0 - 99 mg/dL  OSMOLALITY     Status: Abnormal   Collection Time    02/18/13  9:43 PM      Result Value Range   Osmolality 259 (*) 275 - 300 mOsm/kg  APTT     Status: None   Collection Time    02/18/13  9:43 PM      Result Value Range   aPTT 32  24 - 37 seconds  PROTIME-INR     Status: None   Collection Time    02/18/13  9:43 PM      Result Value Range   Prothrombin Time 13.2  11.6 - 15.2 seconds   INR 1.01  0.00 - 1.49  MAGNESIUM     Status: None   Collection Time    02/18/13  9:43 PM      Result Value Range   Magnesium 1.6  1.5 - 2.5 mg/dL  HEPATIC FUNCTION PANEL     Status: Abnormal   Collection Time    02/18/13  9:43 PM      Result Value Range   Total Protein 6.2  6.0 - 8.3 g/dL   Albumin 3.3 (*) 3.5 - 5.2 g/dL   AST 17  0 - 37 U/L   ALT 11  0 - 35 U/L   Alkaline Phosphatase 64  39 - 117 U/L   Total Bilirubin 0.5  0.3 - 1.2 mg/dL   Bilirubin, Direct 0.1  0.0 - 0.3 mg/dL   Indirect Bilirubin 0.4  0.3 - 0.9 mg/dL  OSMOLALITY, URINE     Status: Abnormal   Collection Time    02/18/13 11:52 PM      Result Value Range   Osmolality, Ur 217 (*) 390 - 1090 mOsm/kg  URINALYSIS, ROUTINE W REFLEX MICROSCOPIC     Status: None   Collection Time    02/18/13 11:52 PM      Result Value Range   Color, Urine YELLOW  YELLOW   APPearance CLEAR  CLEAR   Specific Gravity, Urine 1.009  1.005 - 1.030   pH 6.0  5.0 - 8.0   Glucose, UA NEGATIVE  NEGATIVE mg/dL   Hgb urine dipstick NEGATIVE  NEGATIVE   Bilirubin Urine NEGATIVE  NEGATIVE   Ketones, ur NEGATIVE   NEGATIVE mg/dL   Protein, ur NEGATIVE  NEGATIVE mg/dL   Urobilinogen, UA 0.2  0.0 - 1.0 mg/dL   Nitrite NEGATIVE  NEGATIVE   Leukocytes, UA NEGATIVE  NEGATIVE  NA AND K (SODIUM & POTASSIUM), RAND UR     Status: None   Collection Time    02/18/13 11:52 PM      Result Value Range   Sodium, Ur 14     Potassium Urine Timed 34    CREATININE, URINE, RANDOM     Status: None   Collection Time    02/18/13 11:52 PM      Result Value Range   Creatinine, Urine 26.48    MRSA PCR SCREENING     Status: None   Collection Time    02/19/13  1:32 AM      Result Value Range   MRSA by PCR NEGATIVE  NEGATIVE  BASIC METABOLIC PANEL     Status: Abnormal   Collection Time    02/19/13  3:19 AM      Result Value Range   Sodium 124 (*) 135 - 145 mEq/L   Potassium 2.7 (*) 3.5 - 5.1 mEq/L   Chloride 85 (*) 96 - 112 mEq/L   CO2 29  19 - 32 mEq/L   Glucose, Bld 100 (*) 70 - 99 mg/dL   BUN 17  6 - 23 mg/dL   Creatinine, Ser 4.09  0.50 - 1.10 mg/dL   Calcium 8.6  8.4 - 81.1 mg/dL   GFR calc non Af Amer 60 (*) >90 mL/min   GFR calc Af Amer 70 (*) >90 mL/min  CBC     Status: Abnormal   Collection Time    02/19/13  3:19 AM      Result Value Range   WBC 8.3  4.0 - 10.5 K/uL   RBC 4.82  3.87 - 5.11 MIL/uL   Hemoglobin 16.0 (*) 12.0 - 15.0 g/dL   HCT 91.4  78.2 - 95.6 %   MCV 89.8  78.0 - 100.0 fL   MCH 33.2  26.0 - 34.0 pg   MCHC 37.0 (*) 30.0 - 36.0 g/dL   RDW 21.3  08.6 - 57.8 %   Platelets 209  150 - 400 K/uL  TROPONIN I     Status: None   Collection Time    02/19/13  3:25 AM      Result Value Range   Troponin I <0.30  <0.30 ng/mL  TROPONIN I     Status: None   Collection Time    02/19/13  9:08 AM      Result Value Range   Troponin I <0.30  <0.30 ng/mL    Imaging results:  Dg Chest 2 View  02/18/2013  *RADIOLOGY REPORT*  Clinical Data: Shortness of breath.  Palpitations.  CHEST - 2 VIEW  Comparison: 01/21/2008  Findings:  Normal heart size.  No pleural effusion or edema.  There is no  airspace consolidation identified.  Moderate size hiatal hernia is noted.  IMPRESSION:  1.  No acute cardiopulmonary abnormalities. 2.  Hiatal hernia.   Original Report Authenticated By: Signa Kell, M.D.      Assessment and Plan: I agree with the formulated Assessment and Plan with the following changes:  84 yr. Old female w/ PMHx significant for HTN, depression, GERD, anxiety disorder, hx syncopal episodes, dysrrythmias, presented with a 2 week history of dyspnea, palpitations, with hypotonic hypovolemic hyponatremia (on admission), hypokalemia, with 2nd degree type 2-3rd degree AVB. 1) Hypotonic, hypovolemic, hyponatremia: At this time I would back off on IVF. She has corrected about 6 meq since admission. I suspect this this a result of decrease PO solute intake, increased free water intake, and diuretic use.  Discontinue diuretic and encourage PO intake.  I suspect her depressive episode may have led to this, she will need further follow up care.  She denies suicidal ideation. I suspect her Xanax use is worsening this. 2) AVB: Nifedipine may have some AVN blocking capability, but minimal. Given her history, this may have been occurring for weeks-months. Cardiology consulted. TTE today. Plans for PPM. Replace K, if does not correct, replace Mg. I suspect her elevated proBNP may be due to her obesity, and perhaps a component of diastolic failure? I do not think she is volume overloaded. 3) HTN: Monitor for now, but may resume Diovan if BP tolerates. -rest per resident note.  Jonah Blue, DO 4/7/201411:21 AM

## 2013-02-20 ENCOUNTER — Inpatient Hospital Stay (HOSPITAL_COMMUNITY): Payer: Medicare Other

## 2013-02-20 DIAGNOSIS — Z950 Presence of cardiac pacemaker: Secondary | ICD-10-CM

## 2013-02-20 DIAGNOSIS — I10 Essential (primary) hypertension: Secondary | ICD-10-CM

## 2013-02-20 DIAGNOSIS — F329 Major depressive disorder, single episode, unspecified: Secondary | ICD-10-CM

## 2013-02-20 HISTORY — DX: Presence of cardiac pacemaker: Z95.0

## 2013-02-20 LAB — BASIC METABOLIC PANEL
CO2: 25 mEq/L (ref 19–32)
Chloride: 93 mEq/L — ABNORMAL LOW (ref 96–112)
Glucose, Bld: 120 mg/dL — ABNORMAL HIGH (ref 70–99)
Potassium: 4.4 mEq/L (ref 3.5–5.1)
Sodium: 126 mEq/L — ABNORMAL LOW (ref 135–145)

## 2013-02-20 LAB — CBC WITH DIFFERENTIAL/PLATELET
Lymphocytes Relative: 17 % (ref 12–46)
Lymphs Abs: 1.3 10*3/uL (ref 0.7–4.0)
MCV: 90.3 fL (ref 78.0–100.0)
Neutro Abs: 5.4 10*3/uL (ref 1.7–7.7)
Neutrophils Relative %: 71 % (ref 43–77)
Platelets: 169 10*3/uL (ref 150–400)
RBC: 4.13 MIL/uL (ref 3.87–5.11)
WBC: 7.6 10*3/uL (ref 4.0–10.5)

## 2013-02-20 NOTE — Progress Notes (Signed)
INTERNAL MEDICINE TEACHING SERVICE Attending Note  Date: 02/20/2013  Patient name: Tonya Maddox  Medical record number: [id number]  Date of birth: 10/26/1928    This patient has been seen and discussed with the house staff. Please see their note for complete details. I concur with their findings with the following additions/corrections: Feels well this morning. Some minor discomfort at pacer insertion site and minimal bloody drainage noted on gauze.  Denies CP, SOB. She inquired about continued xanax use and we recommend she follow up in clinic to consider alternative treatment for her depression and anxiety. She is s/p pacer insertion for AVB. Hemodynamically stable. Will need card follow up as scheduled. Hypovolemic hyponatremia: would discontinue diuretic use at discharge. She may have had some decreased solute intake due to depression replaced with hypotonic fluids, leading to worsening hyponatremia.  She will need follow up. She is asymptomatic. HTN: Continue Diovan at discharge. Follow up in clinic. Medically stable for discharge if OK with Cardiology.   Jonah Blue, DO  02/20/2013, 11:17 AM

## 2013-02-20 NOTE — Discharge Summary (Signed)
Internal Medicine Teaching [facility] Discharge Note  Name: Tonya Maddox MRN: [id number] DOB: 1928/10/02 79 y.o.  Date of Admission: 02/18/2013  4:54 PM Date of Discharge: 02/20/2013 Attending Physician: Jonah Blue, DO  Discharge Diagnosis: Principal Problem:   Heart block AV third degree Active Problems:   Depression   GERD (gastroesophageal reflux disease)   Arrhythmia   Anxiety   Bradycardia   Hyponatremia   Chest tightness or pressure   S/P placement of cardiac pacemaker   Discharge Medications:   Medication List    STOP taking these medications       NIFEDICAL XL 60 MG 24 hr tablet  Generic drug:  NIFEdipine     triamterene-hydrochlorothiazide 37.5-25 MG per tablet  Commonly known as:  MAXZIDE-25      TAKE these medications       ALPRAZolam 1 MG 24 hr tablet  Commonly known as:  XANAX XR  Take 1 mg by mouth every morning.     esomeprazole 40 MG capsule  Commonly known as:  NEXIUM  Take 40 mg by mouth daily as needed. For heartburn     meclizine 25 MG tablet  Commonly known as:  ANTIVERT  Take 25 mg by mouth 3 (three) times daily as needed. For dizziness     multivitamin with minerals Tabs  Take 1 tablet by mouth daily.     PRESERVISION AREDS PO  Take 1 tablet by mouth 2 (two) times daily.     valsartan 320 MG tablet  Commonly known as:  DIOVAN  Take 320 mg by mouth daily.        Disposition and follow-up:   Ms.Ashritha M Crabbe was discharged from [facility] in Good condition.  At the hospital follow up visit please address  Patient is interested in establishing care with our clinic.  1) Complete HB s/p PPM Please assess for incision site infection, sx of CP, SOB, palpitations, dizziness. Has cardiology followup. 2) Depression/anxiety Been managed at urgent care and treated w extended release xanax. She would benefit from d/c of BZP and trial of SSRI. This was discussed w her on discharge and she is open to d/c xanax.  Please discuss w her, Rx alternative antidepressant if appropriate. 3) Electrolyte abnl Hyponatremic to 119 on admission, increased to 126 on discharge. Baseline seems to be upper 120s/low 130s. Suspect hypoNa 2/2 diuretic use at home, tea/toast syndrome, decreased po intake prior to admission. Diuretics held on discharge.  Also hypoK w potassium 2.7 on admission, Mg was 1.6. Potassium recovered to 4.4 on discharge.  Please check BMET 4) HTN Patient on valsartan, HCTZ/triamterene and nifedipine at home. Antihypertensives d/c'd during admission due to hypotension. BP increasing modestly on d/c, instructed to hold nifedipine and HCTZ/triamterene and resume valsartan on d/c. Please check BP and make adjustments as needed. Patient is interested in establishing care with our clinic.    Follow-up Appointments: Follow-up Information   Follow up with TILLEY JR,W SPENCER, MD. Schedule an appointment as soon as possible for a visit in 2 weeks.   Contact information:   [address] [place] [address] [phone]       Follow up with Lewayne Bunting, MD In 3 months. (Our office will schedule)    Contact information:   [facility] [address] [place] [address] [phone]      Follow up with [facility] On 03/05/2013. (At 4:30 PM for wound check)  Contact information:   [facility] [address] [place] [address] [phone]      Follow up with Johnette Abraham, DO On 03/01/2013. (10 AM for hospital follow-up)    Contact information:   [facility] [address] [place] [address] [phone]      Discharge Orders   Future Appointments Provider Department Dept Phone   03/01/2013 10:00 AM Priscella Mann, DO [facility] [phone]   03/05/2013 4:30 PM [facility] [place])  [phone]   Future Orders Complete By Expires     Call MD for:  difficulty breathing, headache or visual disturbances  As directed     Call MD for:  extreme fatigue  As directed     Call MD for:  persistant dizziness or light-headedness  As directed     Call MD for:  persistant nausea and vomiting  As directed     Call MD for:  redness, tenderness, or signs of infection (pain, swelling, redness, odor or green/yellow discharge around incision site)  As directed     Call MD for:  severe uncontrolled pain  As directed     Call MD for:  temperature >100.4  As directed     Diet - low sodium heart healthy  As directed     Increase activity slowly  As directed        Consultations: Treatment Team:  Othella Boyer, MD Marinus Maw, MD  Procedures Performed:  Dg Chest 2 View  02/20/2013  *RADIOLOGY REPORT*  Clinical Data: Status post pacemaker placement.  CHEST - 2 VIEW  Comparison: Chest x-ray 02/18/2013.  Findings: Interval placement of a left sided pacemaker device with lead its projecting over the expected location of the right atrium and right ventricular apex.  No definite pneumothorax.  Lung volumes are normal.  No consolidative airspace disease.  No definite pleural effusions.  Mild eventration of the right hemidiaphragm.  No evidence of pulmonary edema.  Heart size is borderline enlarged. The patient is rotated to the left on today's exam, resulting in distortion of the mediastinal contours and reduced diagnostic sensitivity and specificity for mediastinal pathology.  Atherosclerosis in the thoracic aorta.  Moderate sized hiatal hernia is again noted.  IMPRESSION: 1.  Status post placement of pacemaker which appears properly located, as above, with no pneumothorax or other acute complicating features. 2.  Atherosclerosis. 3.  Moderate sized hiatal hernia.   Original Report Authenticated By: Trudie Reed, M.D.    Dg Chest 2 View  02/18/2013  *RADIOLOGY REPORT*  Clinical Data: Shortness  of breath.  Palpitations.  CHEST - 2 VIEW  Comparison: 01/21/2008  Findings:  Normal heart size.  No pleural effusion or edema.  There is no airspace consolidation identified.  Moderate size hiatal hernia is noted.  IMPRESSION:  1.  No acute cardiopulmonary abnormalities. 2.  Hiatal hernia.   Original Report Authenticated By: Signa Kell, M.D.     2D Echo: read pending  Admission HPI:  79 y.o PMH HTN, syncope, arrhythmia, palpitations/bradycardia presents with intermittent SOB (rest and exertion) x 2 weeks , intermittent "heart fluttering"/palpitations, chest tightness/pressure intermittently x months though worsening (worse today) prompting her to call EMS. Sensation is 5/10 central chest without radiation and no reproducibility. She states she has been having a "flickering" that goes away. No radiation of pain. She denies orthopnea. Nothing makes the chest sensation worse. Sometimes Xanax helps her  chest sensation. EMS stated her HR was in the 40s.   Hospital Course by problem list:  1. Symptomatic high degree AV block  Mrs. Hemric presented with a two week history of shortness of breath, dizziness, syncope, and chest pain.  She was bradycardic on admission w HR in 40s. An EKG was performed and she was found to have 3rd degree complete heart block and alternately Mobitz II. She was not on AV nodal blocking meds. Cardiac troponins were also checked which returned normal. Prior cardiac evaluation by Dr. Donnie Aho include a transthoracic echocardiogram 05/04/2004 which showed LVEF 65-75%. Cardiac catheterization from 11/29/2002 showed no angiographically significant CAD Cardiology was then consulted and recommended a dual chamber permanent pacemaker, suspect cardiac fibrosis affecting conduction system.  Her pacemaker was put in on 02/19/13 without immediate complications.  Follow-up chest x-ray showed stable leads with no pneumothorax.  Cardiology would like to follow up in 2 weeks to assess pacemaker  function. On day of discharge, she felt well without fever, chills, CP, SOB, or wound site drainage/swelling.   2. Asymptomatic Hypovolemic Hyponatremia   On admission her serum Na was found to be as low as 119.  Urine osms (217) and sodium (14) were low, indicating that patient was appropriately diluting urine.  She did not complain of any lethargy, confusion or altered mental status.  Though to likely be caused be caused by hypovolemic hyponatremia secondary to HCTZ use.  Another possible cause could be related to her depression leading to "tea and toast" syndrome.  She was given normal saline and corrected to 124.  At day of discharge her Na = 126.  Her baseline looking at previous visits was determined to be high 120s to low 130s.  Will continue to monitor in outpatient setting.  3. Hypokalemia   During this hospitalization her K was 2.7 on admission.  She was repleted with oral potassium intake 3 times daily.  At day of discharge her K was 4.4. She is currently asymptomatic.  No oral replacement on discharge. Check Bmet at hospital f/u.  4. Depression/Anxiety history   At admission she stated she had a history of depression and that it was uncontrolled. Has been taking xanax for mood prescribed by urgent care provider. Has no PCP.  During this hospitalization Mrs. Blazier did not complain of depressive symptoms.  No SI/HI. This needs further evaluation in outpatient setting given that it could have contributed to her hyponatremia (decreased po intake, "tea and toast" syndrome).On day of discharge, she was in good spirits and pleasant. Plan is to continue her on her home dose of Xanax to prevent w/d, but will follow up in outpatient setting to consider alternate therapy as we believe this is not the best medication to treat her symptoms. She was instructed on all of the negative SE of xanax - gait instability and falls being particularly dangerous. We talked about the need to initiate alternative  therapy as outpatient, and she expressed willingness.   5. HTN history  During this hospitalization her BPs have been at goal (< 150/90).  Given her hyponatremia we will discontinue Maxzide and continue her only on Diovan for now. Holding CCP (nifedipine) as well. Will reassess in outpatient follow up next week.  6. History of GERD  She did not complain of this during this hospitalization.   Discharge Vitals:  BP 146/74  Pulse 62  Temp(Src) 97.9 F (36.6 C) (Oral)  Resp 17  Ht 5\' 4"  (1.626 m)  Wt 82.8 kg (  182 lb 8.7 oz)  BMI 31.32 kg/m2  SpO2 99%  Discharge Labs:  Results for orders placed during the hospital encounter of 02/18/13 (from the past 24 hour(s))  BASIC METABOLIC PANEL     Status: Abnormal   Collection Time    02/19/13  5:00 PM      Result Value Range   Sodium 128 (*) 135 - 145 mEq/L   Potassium 3.9  3.5 - 5.1 mEq/L   Chloride 91 (*) 96 - 112 mEq/L   CO2 26  19 - 32 mEq/L   Glucose, Bld 100 (*) 70 - 99 mg/dL   BUN 12  6 - 23 mg/dL   Creatinine, Ser 4.09  0.50 - 1.10 mg/dL   Calcium 8.8  8.4 - 81.1 mg/dL   GFR calc non Af Amer 77 (*) >90 mL/min   GFR calc Af Amer 89 (*) >90 mL/min  CBC WITH DIFFERENTIAL     Status: Abnormal   Collection Time    02/20/13  4:58 AM      Result Value Range   WBC 7.6  4.0 - 10.5 K/uL   RBC 4.13  3.87 - 5.11 MIL/uL   Hemoglobin 13.7  12.0 - 15.0 g/dL   HCT 91.4  78.2 - 95.6 %   MCV 90.3  78.0 - 100.0 fL   MCH 33.2  26.0 - 34.0 pg   MCHC 36.7 (*) 30.0 - 36.0 g/dL   RDW 21.3  08.6 - 57.8 %   Platelets 169  150 - 400 K/uL   Neutrophils Relative 71  43 - 77 %   Neutro Abs 5.4  1.7 - 7.7 K/uL   Lymphocytes Relative 17  12 - 46 %   Lymphs Abs 1.3  0.7 - 4.0 K/uL   Monocytes Relative 10  3 - 12 %   Monocytes Absolute 0.8  0.1 - 1.0 K/uL   Eosinophils Relative 1  0 - 5 %   Eosinophils Absolute 0.1  0.0 - 0.7 K/uL   Basophils Relative 0  0 - 1 %   Basophils Absolute 0.0  0.0 - 0.1 K/uL  BASIC METABOLIC PANEL     Status: Abnormal    Collection Time    02/20/13  4:58 AM      Result Value Range   Sodium 126 (*) 135 - 145 mEq/L   Potassium 4.4  3.5 - 5.1 mEq/L   Chloride 93 (*) 96 - 112 mEq/L   CO2 25  19 - 32 mEq/L   Glucose, Bld 120 (*) 70 - 99 mg/dL   BUN 17  6 - 23 mg/dL   Creatinine, Ser 4.69  0.50 - 1.10 mg/dL   Calcium 8.4  8.4 - 62.9 mg/dL   GFR calc non Af Amer 63 (*) >90 mL/min   GFR calc Af Amer 73 (*) >90 mL/min    Signed: Bronson Curb 02/20/2013, 11:35 AM   Time Spent on Discharge:  35 min  Services Ordered on Discharge: Home PT Equipment Ordered on Discharge: [employer]

## 2013-02-20 NOTE — Progress Notes (Addendum)
Patient: Tonya Maddox Date of Encounter: 02/20/2013, 8:13 AM Admit date: 02/18/2013     Subjective  [name] has no complaints this AM. She denies CP or SOB.   Objective  Physical Exam: Vitals: BP 146/74  Pulse 62  Temp(Src) 97.9 F (36.6 C) (Oral)  Resp 17  Ht 5\' 4"  (1.626 m)  Wt 182 lb 8.7 oz (82.8 kg)  BMI 31.32 kg/m2  SpO2 99% General: Well developed, well appearing 79 year old female in no acute distress. Neck: Supple. JVD not elevated. Lungs: Clear bilaterally to auscultation without wheezes, rales, or rhonchi. Breathing is unlabored. Heart: RRR S1 S2 without murmurs, rubs, or gallops.  Abdomen: Soft, non-distended. Extremities: No clubbing or cyanosis. No edema.  Distal pedal pulses are 2+ and equal bilaterally. Neuro: Alert and oriented X 3. Moves all extremities spontaneously. No focal deficits. Wound: Implant site intact without bleeding or hematoma.  Intake/Output:  Intake/Output Summary (Last 24 hours) at 02/20/13 0813 Last data filed at 02/19/13 1900  Gross per 24 hour  Intake    445 ml  Output    750 ml  Net   -305 ml    Inpatient Medications:  .  ceFAZolin (ANCEF) IV  2 g Intravenous Q6H  . heparin  5,000 Units Subcutaneous Q8H  . potassium chloride  40 mEq Oral TID  . sodium chloride  3 mL Intravenous Q12H   . sodium chloride 50 mL/hr at 02/19/13 1300    Labs:  Recent Labs  02/18/13 1750 02/18/13 2143  02/19/13 1700 02/20/13 0458  NA 119*  --   < > 128* 126*  K 3.9  --   < > 3.9 4.4  CL 83*  --   < > 91* 93*  CO2 24  --   < > 26 25  GLUCOSE 102*  --   < > 100* 120*  BUN 19  --   < > 12 17  CREATININE 0.88  --   < > 0.74 0.84  CALCIUM 9.5  --   < > 8.8 8.4  MG  --  1.6  --   --   --   < > = values in this interval not displayed.  Recent Labs  02/18/13 2143  AST 17  ALT 11  ALKPHOS 64  BILITOT 0.5  PROT 6.2  ALBUMIN 3.3*    Recent Labs  02/18/13 1750 02/19/13 0319 02/20/13 0458  WBC 8.1 8.3 7.6  NEUTROABS 6.2  --   5.4  HGB 15.3* 16.0* 13.7  HCT 41.4 43.3 37.3  MCV 88.7 89.8 90.3  PLT 189 209 169    Recent Labs  02/18/13 1745 02/18/13 2138 02/19/13 0325 02/19/13 0908  TROPONINI <0.30 <0.30 <0.30 <0.30    Recent Labs  02/18/13 2139  CHOL 117  HDL 56  LDLCALC 55  TRIG 31  CHOLHDL 2.1    Recent Labs  02/18/13 2143  INR 1.01    Radiology/Studies: CXR: pending this AM  Telemetry: AV paced Device interrogation: performed by industry this AM shows normal PPM function    Assessment and Plan  1. Symptomatic bradycardia, CHB s/p PPM implant [name] is doing well post PPM implant yesterday. Normal PPM function. Wound intact without bleeding or hematoma. CXR pending. If CXR shows stable lead placement without PTX, [name] is stable for DC home from EP standpoint. Wound care, activity restrictions and follow-up reviewed.  Signed, Exie Parody  EP Attending  Patient seen and examined.  Agree with above. Ok for discharge. We will need to see her back in 10 days for incision check in device clinic. Leonia Reeves.D.

## 2013-02-20 NOTE — Progress Notes (Signed)
Medical Student Daily Progress Note   Subjective:    Interval Events:  No acute events overnight.  Patient does not complain of chest pain, dizziness, nausea/vomiting or shortness of breath.  She is in good spirits this morning.    Objective:    Vital Signs:   Temp:  [97.3 F (36.3 C)-98.3 F (36.8 C)]    97.9 F (36.6 C) (04/08 0338) Pulse Rate:  [40-62]       62 (04/08 0338) Resp:  [12-18]       17 (04/08 0338) BP: (106-146)/(33-94)      146/74 mmHg (04/08 0338) SpO2:  [96 %-100 %]       99 % (04/08 0338) Last BM Date: 02/19/13   Physical Exam: GENERAL: well developed, well nourished; no acute distress HEAD: atraumatic, normocephalic EYES: sclera anicteric; normal conjunctiva MOUTH/THROAT: oropharynx clear, moist mucous membranes NECK: supple, no carotid bruits, thyroid normal in size and without palpable nodules LUNGS: clear to auscultation bilaterally, normal work of breathing HEART: normal rate and regular rhythm; normal S1 and S2 without S3 or S4; no murmurs, rubs, or clicks PULSES: radial 2+ and symmetric ABDOMEN: soft, non-tender, normal bowel sounds SKIN: warm, dry, intact, normal turgor, no rashes, has a wound that looks to have some bleeding and drainage EXTREMITIES: no peripheral edema, clubbing, or cyanosis    Labs: Basic Metabolic Panel:  Recent Labs Lab 02/18/13 2143 02/19/13 1700 02/20/13 0458  NA  --  128* 126*  K  --  3.9 4.4  CL  --  91* 93*  CO2  --  26 25  GLUCOSE  --  100* 120*  BUN  --  12 17  CREATININE  --  0.74 0.84  CALCIUM  --  8.8 8.4  MG 1.6  --   --     CBC:  Recent Labs Lab 02/19/13 0319 02/20/13 0458  WBC 8.3 7.6  NEUTROABS  --  5.4  HGB 16.0* 13.7  HCT 43.3 37.3  MCV 89.8 90.3  PLT 209 169    Other results: EKG Results:  02/20/2013 Rate:  61 PR:  154 QRS:  166 QTc:  485 EKG: AV dual paced rhythm  Imaging: Dg Chest 2 View  02/18/2013  *RADIOLOGY REPORT*  Clinical Data: Shortness of breath.  Palpitations.   CHEST - 2 VIEW  Comparison: 01/21/2008  Findings:  Normal heart size.  No pleural effusion or edema.  There is no airspace consolidation identified.  Moderate size hiatal hernia is noted.  IMPRESSION:  1.  No acute cardiopulmonary abnormalities. 2.  Hiatal hernia.   Original Report Authenticated By: Signa Kell, M.D.       Medications:    Infusions: . sodium chloride 50 mL/hr at 02/19/13 1300     Scheduled Medications: .  ceFAZolin (ANCEF) IV  2 g Intravenous Q6H  . heparin  5,000 Units Subcutaneous Q8H  . potassium chloride  40 mEq Oral TID  . sodium chloride  3 mL Intravenous Q12H     PRN Medications: acetaminophen, alum & mag hydroxide-simeth, ondansetron (ZOFRAN) IV    Assessment/ Plan:    Assessment & Plan by Problem:  79 yo F with PMH of HTN, syncope, arrhythmia, palpitations and bradycardia who presents with intermittent shortness of breath at rest and exertion for 2 weeks. She has also had intermittent "heart fluttering", palpitations, and chest tightness/pressure intermittently for months.   1. Symptomatic high degree AV block - DDx for etiology includes increased vagal tone and drugs (she is not  taking any nodal blocking agents). History of Bradycardia noted since 2004. On admission HR was 37. There was a 3rd degree heart block noted on EKG done yesterday and repeat EKG today showed sinus rhythm with 2nd Degree heart block Mobitz I. We have ruled out MI with 3 negative troponins. There is no evidence of anemia or infection. Her last echo was in 04/2004 with EF 65-75%. Also showed Mild mitral annular calcification, mild aortic and tricuspid valvular regurgitation. She has a history of diastolic dysfunction. Her cardiac cath in 2004 showed no significant CAD. Pro BNP 947.2. Coagulation studies and LFTs returned within normal limits.   Plan: -Cardiology put in PPM yesterday without immediate complications.  -Hold Nifedipine XL 60 mg qd  -Zoll pads to bedside   2.  Palpitations - stable. She has anxiety which may cause these palpitations or it may be related to her arrhythmia as above.   3. Asymptomatic Hyponatremia - Etiology could likely be hypovolemic hyponatremia seeing as patient does not look overloaded on exam. She is also on diuretics (HCTZ) at home which we are currently holding.  Given patients history of depression and water ingestion this could be due to "tea & toast" syndrome.   Plan:  -Na 126 today -Encourage patient intake of PO fluids  4. Hypokalemia - Her K today was 3.9. She is currently asymptomatic. Resolved.  5. Depression/Anxiety history  -Depression appears uncontrolled. The patient states so herself. This needs further evaluation in outpatient setting.  Today, she was in good spirits and pleasant.   Plan to continue home regimen of Xanax.   6. HTN history - improved.  -BP 146/74 this AM.  Mildly elevated from baseline but likely due to all BP meds being held yesterday.  -Will monitor VS.  -Holding Nifedipine and Maxzide, and Diovan.  Will continue these on discharge.   7. History of GERD  -No complaints this AM.  -Patient intermittently takes Nexium as needed.   8. F/E/N  -Will continue to monitor and replace electrolytes as needed.   9. DVT px  -Heparin   Dispo:  Since there have been no complications with PPM placement will likely discharge today.  The patient does have a current PCP Dr. Providence Lanius, therefore will be requiring OPC follow-up after discharge.  The patient does not have transportation limitations that hinder transportation to clinic appointments.     Length of Stay: 2 days   This is a Psychologist, occupational Note.  The care of the patient was discussed with Dr. Heloise Beecham and the assessment and plan formulated with their assistance.  Please see their attached note or addendum for official documentation of the daily encounter.   Resident Co-sign Daily Note: I have seen the patient and reviewed the daily  progress note by Jacqualine Mau MS 4 and discussed the care of the patient with them.  See below for documentation of my findings, assessment, and plans.  Subjective: Resting in bed. Says feels well after procedure. No CP, SOB, N/V/D.  No fevers or chills. Wants to go home. Interested in finding PCP.   Objective: Vital signs in last 24 hours: Filed Vitals:   02/19/13 1800 02/19/13 1836 02/19/13 2003 02/20/13 0338  BP: 132/69 106/57 110/53 146/74  Pulse: 60 60 61 62  Temp:   97.9 F (36.6 C) 97.9 F (36.6 C)  TempSrc:   Oral Oral  Resp:   18 17  Height:      Weight:      SpO2: 98% 97% 98%  99%   Physical Exam: Vitals reviewed.  General: resting in bed, NAD  HEENT: PERRL, EOMI, no scleral icterus  Cardiac: HR 40s w reg rhythm, systolic ejection murmur RUSB  Pulm: clear to auscultation bilaterally, no wheezes, rales, or rhonchi  Abd: soft, nontender, nondistended, BS present  Ext: warm and well perfused, no pedal edema  Neuro: alert and oriented X3, cranial nerves II-XII grossly intact, strength and sensation to light touch equal in bilateral upper and lower extremities  Lab Results: Reviewed and documented in Electronic Record Micro Results: Reviewed and documented in Electronic Record Studies/Results: Reviewed and documented in Electronic Record Medications: I have reviewed the patient's current medications. Scheduled Meds: .  ceFAZolin (ANCEF) IV  2 g Intravenous Q6H  . heparin  5,000 Units Subcutaneous Q8H  . potassium chloride  40 mEq Oral TID  . sodium chloride  3 mL Intravenous Q12H   Continuous Infusions: . sodium chloride 50 mL/hr at 02/19/13 1300   PRN Meds:.acetaminophen, alum & mag hydroxide-simeth, ondansetron (ZOFRAN) IV Assessment/Plan: 1. Sx high degree AV block -  Tolerated PPM placement well with no events. HR regular 60 bpm.   2. Asymptomatic Hyponatremia  Etiology could likely be hypovolemic hyponatremia seeing as patient does not look volume  overloaded on exam. Also on diuretics at home, holding now. Given patients history of depression and water ingestion this could be due to "tea & toast" syndrome.  Na has corrected appropriately slowly, now 126 this am. Maintenance NS 50 cc/hr per cards for procedure.  - f/u as outpt  3. Hypokalemia  Her K on admission was 2.7. Now stable 3.9.   4 Depression/Anxiety history  This needs further evaluation outpatient. If deemed necessary inpatient. Today, she was in good spirits and pleasant. Wants to find a PCP - to continue current regimen of Xanax. Will need to transition to SSRI if tolerated as outpatient.   6. HTN history - improved.  BP 146/47 this AM.  -Will monitor VS.  -Holding Nifedipine and Maxzide   7. History of GERD  Used maalox last night w improvement  8. F/E/N  Will continue to monitor and replace electrolytes as needed.  Heart diet  9. DVT px  heparin   LOS: 2 days   Bronson Curb 02/20/2013, 8:55 AM

## 2013-02-20 NOTE — Progress Notes (Addendum)
Assessment unchanged. Discussed D/C instructions with pt and family including education specific to pacemaker.  Verbalized understanding. IV and tele removed. Pt left via W/C accompanied by [employer].

## 2013-02-20 NOTE — Progress Notes (Signed)
Clinical Child psychotherapist (CSW) received an inappropriate referral for to assist with establishing pt with a PCP. CSW to notify RNCM of requests. CSW signing off.  Theresia Bough, MSW, Theresia Majors [phone]

## 2013-02-20 NOTE — Op Note (Signed)
NAMEJONNI, Tonya Maddox NO.:  [id number]  MEDICAL RECORD NO.:  [id number]  LOCATION:  2006                         FACILITY:  [facility]  PHYSICIAN:  Doylene Canning. Ladona Ridgel, MD    DATE OF BIRTH:  14-Feb-1928  DATE OF PROCEDURE:  02/19/2013 DATE OF DISCHARGE:                              OPERATIVE REPORT   PROCEDURE PERFORMED:  Insertion of a dual-chamber pacemaker.  INDICATION:  Symptomatic complete heart block.  INTRODUCTION:  The patient is an 79 year old woman with a history of tachy palpitations and symptomatic bradycardia.  She was admitted to the hospital with symptomatic bradycardia and was found to have intermittent complete heart block, along with 2:1 heart block along with AV Wenckebach block in the absence of any AV nodal blocking or sinus nodal blocking drugs and she is now referred for insertion of a dual-chamber pacemaker.    PROCEDURE:  After informed consent was obtained, the patient was taken to the diagnostic EP lab in a fasting state.  After usual preparation and draping, intravenous fentanyl and midazolam were given for sedation. A 30 mL of lidocaine was infiltrated into the left infraclavicular region.  A 5-cm incision was carried out over this region. Electrocautery was utilized to dissect down to the fascial plane.  The left subclavian vein was then punctured x2 and a Medtronic model 5076 52- cm active fixation pacing lead, serial #[id number] was advanced to the right ventricle and a Medtronic model 5076, 45-cm active fixation pacing lead, serial #[id number] was advanced to the right atrium.  Mapping was first carried out in the right ventricle.  At the final site, the R- waves measured 15 mV, the pacing impedance was 1100 ohms, and the threshold less than a V at 0.5 milliseconds.  A 10 V pacing did not stimulate the diaphragm and there was large injury current with active fixation lead.  With the ventricular lead in satisfactory  position, attention was then turned to placement of the atrial lead which was placed in anterolateral portion of the right atrium where P-waves measured 5 mV.  The pacing impedance was 645 ohms with the lead actively fixed and threshold 0.4 V at 0.5 milliseconds.  Again, 10 V pacing did not stimulate the diaphragm.  With both atrial and ventricular leads in satisfactory position, they were secured to the subpectoral fascia with a figure-of-eight silk suture and the sewing sleeve was secured with silk suture.  Electrocautery was utilized to make a subcutaneous pocket. Antibiotic irrigation was utilized to irrigate the pocket and electrocautery was utilized to assure hemostasis.  A Medtronic Sensia dual-chamber pacemaker, serial #[id number] was connected to the atrial and ventricular leads and placed back in the subcutaneous pocket.  The pocket was irrigated with antibiotic irrigation and silk suture was utilized to secure the generator, and the incision was closed with 2-0 and 3-0 Vicryl after the pocket had been irrigated with antibiotic irrigation.  Benzoin and Steri-Strips were painted on the skin, and a pressure dressing applied, and the patient was returned to her room in satisfactory condition.  COMPLICATIONS:  There were no immediate procedure complications.  RESULTS:  This demonstrates successful implantation of a  Medtronic dual- chamber pacemaker in a patient with symptomatic bradycardia due to intermittent complete heart block.     Doylene Canning. Ladona Ridgel, MD     GWT/MEDQ  D:  02/19/2013  T:  02/20/2013  Job:  [id number]  cc:   Georga Hacking, M.D.

## 2013-02-20 NOTE — Evaluation (Signed)
Physical Therapy Evaluation Patient Details Name: Tonya Maddox MRN: [id number] DOB: 12/17/1927 Today's Date: 02/20/2013 Time: 1478-2956 PT Time Calculation (min): 27 min  PT Assessment / Plan / Recommendation Clinical Impression  Pt is a pleasent 79 y.o. female s/p pacemaker placement. Patient demonstrates some overall deficits in functional mobility seoncdary to pain, deconditioning, balance deficits and decreased activity tolerance. Pt will benefit from continued PT to maximize functional mobility. Rec HHPT for discharge planning and recommend use of SPC at discharge.    PT Assessment  Patient needs continued PT services    Follow Up Recommendations  Home health PT;Supervision - Intermittent    Does the patient have the potential to tolerate intense rehabilitation      Barriers to Discharge None (will be [facility])      Equipment Recommendations  Cane    Recommendations for Other Services     Frequency Min 3X/week    Precautions / Restrictions Precautions Precautions: ICD/Pacemaker Required Braces or Orthoses: Other Brace/Splint Other Brace/Splint: LUE sling Restrictions Weight Bearing Restrictions: No   Pertinent Vitals/Pain No pain at this time      Mobility  Bed Mobility Bed Mobility: Supine to Sit;Sitting - Scoot to [place] of Bed;Sit to Supine Supine to Sit: 5: Supervision Sitting - Scoot to Edge of Bed: 5: Supervision Sit to Supine: 5: Supervision Details for Bed Mobility Assistance: Increased time secondary to restricions with LUE Transfers Transfers: Sit to Stand;Stand to Sit Sit to Stand: 5: Supervision Stand to Sit: 5: Supervision Details for Transfer Assistance: Increased time secondary to restrictions for LUE Ambulation/Gait Ambulation/Gait Assistance: 4: Min guard Ambulation Distance (Feet): 160 Feet Assistive device:  (IV pole) Ambulation/Gait Assistance Details: Slow but steady with gait using IV pole, 2 rest breaks needed  secondary to faitgue, HR and O2 sats remained stable. Will try use of SPC. Gait Pattern: Step-through pattern;Decreased stride length Gait velocity: decreased General Gait Details: easily fatigues secondary to immobility Stairs: No    Exercises General Exercises - Lower Extremity Ankle Circles/Pumps: Both;10 reps Long Arc Quad: Both;10 reps Hip Flexion/Marching: Both;10 reps   PT Diagnosis: Difficulty walking;Abnormality of gait;Generalized weakness  PT Problem List: Decreased strength;Decreased range of motion;Decreased activity tolerance;Decreased balance;Decreased mobility;Decreased knowledge of use of DME PT Treatment Interventions: DME instruction;Gait training;Stair training;Functional mobility training;Therapeutic activities;Therapeutic exercise;Balance training;Patient/family education   PT Goals Acute Rehab PT Goals PT Goal Formulation: With patient Time For Goal Achievement: 02/27/13 Potential to Achieve Goals: Good Pt will go Sit to Stand: with modified independence PT Goal: Sit to Stand - Progress: Goal set today Pt will go Stand to Sit: with modified independence PT Goal: Stand to Sit - Progress: Goal set today Pt will Ambulate: with modified independence PT Goal: Ambulate - Progress: Goal set today Pt will Go Up / Down Stairs: 1-2 stairs;with min assist PT Goal: Up/Down Stairs - Progress: Goal set today  Visit Information  Last PT Received On: 02/20/13    Subjective Data  Subjective: Oh good I was really off balance yesterday Patient Stated Goal: to go home to [facility] then eventually home   Prior Functioning  Home Living Lives With: Alone Available Help at Discharge: Family;Available 24 hours/day Type of Home: House Home Access: Stairs to enter [employer] of Steps: 2 Home Layout: One level Bathroom Shower/Tub: Engineer, manufacturing systems: Standard Home Adaptive Equipment: None Prior Function Level of Independence: Independent Able to Take  Stairs?: Yes Driving: Yes Vocation: Retired Musician: [facility] Dominant Hand: Right  Cognition  Cognition Overall Cognitive Status: Appears within functional limits for tasks assessed/performed Arousal/Alertness: Awake/alert Orientation Level: Appears intact for tasks assessed;Oriented X4 / Intact Behavior During Session: [facility] for tasks performed    Extremity/Trunk Assessment Right Upper Extremity Assessment RUE ROM/Strength/Tone: [facility] for tasks assessed Left Upper Extremity Assessment LUE ROM/Strength/Tone: Unable to fully assess;Due to precautions Right Lower Extremity Assessment RLE ROM/Strength/Tone: [facility] for tasks assessed Left Lower Extremity Assessment LLE ROM/Strength/Tone: WFL for tasks assessed   Balance Balance Balance Assessed: Yes High Level Balance High Level Balance Activites: Side stepping;Backward walking;Direction changes High Level Balance Comments: no significant loss of balance, some minor instability but better with IV pole for stabilization  End of Session PT - End of Session Equipment Utilized During Treatment: Gait belt (arm sling) Activity Tolerance: Patient tolerated treatment well Patient left: in bed (with transport to head to xray) Nurse Communication: Mobility status  GP     Fabio Asa 02/20/2013, 9:49 AM Charlotte Crumb, PT DPT  [phone]

## 2013-02-20 NOTE — Progress Notes (Signed)
Stable post pacer insertion.  I need to see in 2 weeks.  Darden Palmer MD [facility]

## 2013-02-21 ENCOUNTER — Telehealth: Payer: Self-pay | Admitting: Internal Medicine

## 2013-02-21 NOTE — Telephone Encounter (Signed)
Have tried to call patient back on number listed and the number is disconnected

## 2013-02-21 NOTE — Telephone Encounter (Signed)
New Prob    Dr. Ladona Ridgel put in a pacemaker for pt on Monday. She states she is experiencing dizziness and feels nausiated. Concerned and would like to speak to  Nurse.

## 2013-02-21 NOTE — Care Management Note (Signed)
I received a call from our office reporting that they were trying to reach Miss Minchew to schedule her PT visit. Unfortunately both phone numbers listed are out of service and we cannot reach her. We will continue to try to call so we can provide timely service for her PT. I have contacted CM Sidney Ace to let her know and have also checked for any additional phone contacts, and there are not any others.  Ayesha Rumpf RN BSN Care Transition Liaison [facility] [phone]

## 2013-02-21 NOTE — Discharge Summary (Signed)
INTERNAL MEDICINE TEACHING SERVICE Attending Note  Date: 02/21/2013  Patient name: Tonya Maddox  Medical record number: [id number]  Date of birth: 08/30/1928    This patient has been seen and discussed with the house staff. Please see their note for complete details. I concur with their findings and plan.    Jonah Blue, DO  02/21/2013, 3:29 PM

## 2013-02-22 NOTE — Telephone Encounter (Signed)
New problem    Skilled nursing order into the cardio program . Currently only have physical therapy orders.  Can take a verbal

## 2013-02-22 NOTE — Telephone Encounter (Signed)
Follow up   Pt stated no one called her back yesterday from her message when she called. The number that was on file was the wrong number. The correct number for the pt is [phone]. Please call pt.

## 2013-02-23 NOTE — Telephone Encounter (Signed)
Patient states she feels better today, she only had one day where she was feeling a bit lightheaded when she first got up.  She is going to keep her wound check appointment as scheduled and call me back if needed.  I let her know if she calls the office and does not hear back from me by 4pm the same day and it is not an urgent matter to please call and let the schedulers know she has not heard from me as something soul have happened with the call as it did this tim.  The number ws ut in wrong

## 2013-02-24 ENCOUNTER — Emergency Department (HOSPITAL_COMMUNITY): Payer: Medicare Other

## 2013-02-24 ENCOUNTER — Encounter (HOSPITAL_COMMUNITY): Payer: Self-pay | Admitting: Emergency Medicine

## 2013-02-24 ENCOUNTER — Emergency Department (HOSPITAL_COMMUNITY)
Admission: EM | Admit: 2013-02-24 | Discharge: 2013-02-24 | Disposition: A | Discharge status: Home / Self Care | Payer: Medicare Other | Attending: Emergency Medicine | Admitting: Emergency Medicine

## 2013-02-24 DIAGNOSIS — Z8739 Personal history of other diseases of the musculoskeletal system and connective tissue: Secondary | ICD-10-CM | POA: Insufficient documentation

## 2013-02-24 DIAGNOSIS — Z950 Presence of cardiac pacemaker: Secondary | ICD-10-CM | POA: Insufficient documentation

## 2013-02-24 DIAGNOSIS — R0600 Dyspnea, unspecified: Secondary | ICD-10-CM

## 2013-02-24 DIAGNOSIS — H9190 Unspecified hearing loss, unspecified ear: Secondary | ICD-10-CM | POA: Insufficient documentation

## 2013-02-24 DIAGNOSIS — Z8679 Personal history of other diseases of the circulatory system: Secondary | ICD-10-CM | POA: Insufficient documentation

## 2013-02-24 DIAGNOSIS — I10 Essential (primary) hypertension: Secondary | ICD-10-CM | POA: Insufficient documentation

## 2013-02-24 DIAGNOSIS — K219 Gastro-esophageal reflux disease without esophagitis: Secondary | ICD-10-CM | POA: Insufficient documentation

## 2013-02-24 DIAGNOSIS — Z8669 Personal history of other diseases of the nervous system and sense organs: Secondary | ICD-10-CM | POA: Insufficient documentation

## 2013-02-24 DIAGNOSIS — F329 Major depressive disorder, single episode, unspecified: Secondary | ICD-10-CM | POA: Insufficient documentation

## 2013-02-24 DIAGNOSIS — F3289 Other specified depressive episodes: Secondary | ICD-10-CM | POA: Insufficient documentation

## 2013-02-24 DIAGNOSIS — Z8659 Personal history of other mental and behavioral disorders: Secondary | ICD-10-CM

## 2013-02-24 DIAGNOSIS — E669 Obesity, unspecified: Secondary | ICD-10-CM | POA: Insufficient documentation

## 2013-02-24 DIAGNOSIS — F411 Generalized anxiety disorder: Secondary | ICD-10-CM | POA: Insufficient documentation

## 2013-02-24 DIAGNOSIS — Z79899 Other long term (current) drug therapy: Secondary | ICD-10-CM | POA: Insufficient documentation

## 2013-02-24 DIAGNOSIS — R220 Localized swelling, mass and lump, head: Secondary | ICD-10-CM | POA: Insufficient documentation

## 2013-02-24 DIAGNOSIS — Z6839 Body mass index (BMI) 39.0-39.9, adult: Secondary | ICD-10-CM | POA: Insufficient documentation

## 2013-02-24 DIAGNOSIS — R221 Localized swelling, mass and lump, neck: Secondary | ICD-10-CM

## 2013-02-24 DIAGNOSIS — R0989 Other specified symptoms and signs involving the circulatory and respiratory systems: Secondary | ICD-10-CM | POA: Insufficient documentation

## 2013-02-24 DIAGNOSIS — R0609 Other forms of dyspnea: Secondary | ICD-10-CM | POA: Insufficient documentation

## 2013-02-24 LAB — COMPREHENSIVE METABOLIC PANEL
AST: 30 U/L (ref 0–37)
Albumin: 3.7 g/dL (ref 3.5–5.2)
Alkaline Phosphatase: 71 U/L (ref 39–117)
BUN: 20 mg/dL (ref 6–23)
Chloride: 91 mEq/L — ABNORMAL LOW (ref 96–112)
Potassium: 4.6 mEq/L (ref 3.5–5.1)
Sodium: 125 mEq/L — ABNORMAL LOW (ref 135–145)
Total Protein: 6.5 g/dL (ref 6.0–8.3)

## 2013-02-24 LAB — CBC WITH DIFFERENTIAL/PLATELET
Basophils Absolute: 0 10*3/uL (ref 0.0–0.1)
Basophils Relative: 1 % (ref 0–1)
Eosinophils Absolute: 0.1 10*3/uL (ref 0.0–0.7)
MCH: 32.7 pg (ref 26.0–34.0)
MCHC: 35.8 g/dL (ref 30.0–36.0)
Monocytes Relative: 7 % (ref 3–12)
Neutro Abs: 4.9 10*3/uL (ref 1.7–7.7)
Neutrophils Relative %: 69 % (ref 43–77)
Platelets: 153 10*3/uL (ref 150–400)
RDW: 12.6 % (ref 11.5–15.5)

## 2013-02-24 LAB — PRO B NATRIURETIC PEPTIDE: Pro B Natriuretic peptide (BNP): 710.9 pg/mL — ABNORMAL HIGH (ref 0–450)

## 2013-02-24 MED ORDER — FUROSEMIDE 10 MG/ML IJ SOLN
20.0000 mg | Freq: Once | INTRAMUSCULAR | Status: AC
  Administered 2013-02-24: 20 mg via INTRAVENOUS
  Filled 2013-02-24: qty 2

## 2013-02-24 MED ORDER — LORAZEPAM 2 MG/ML IJ SOLN
1.0000 mg | Freq: Once | INTRAMUSCULAR | Status: AC
  Administered 2013-02-24: 1 mg via INTRAVENOUS
  Filled 2013-02-24: qty 1

## 2013-02-24 NOTE — ED Notes (Signed)
Pt dc to home.  Pt states understanding to dc instructions. Pt taken to car via w/c.  Nad.

## 2013-02-24 NOTE — ED Provider Notes (Signed)
History     CSN: [id number]  Arrival date & time 02/24/13  0015   First MD Initiated Contact with Patient 02/24/13 0109      Chief Complaint  Patient presents with  . Shortness of Breath    (Consider location/radiation/quality/duration/timing/severity/associated sxs/prior treatment) HPI Tonya Maddox is a 79 y.o. female who had a pacemaker placed for third-degree heart block on 02/18/2013 and starting last night had some shortness of breath. She's noticed that this has gotten worse over the course of the day, it is now moderate to severe, she feels like she can't take a deep breath, she feels that she may have a lump in her throat "like you have to burp but can't." She said she took a Zantac without any relief. She's having no chest pain no productive cough, no fever, no chills, she's had a little bit of lower extremity edema.    Past Medical History  Diagnosis Date  . Depression   . Hypertension   . Vertigo   . GERD (gastroesophageal reflux disease)   . Arrhythmia   . Anxiety   . Syncope     2005  . Bradycardia     2004  . Arthritis     knees and right hip  . Hard of hearing   . Obesity (BMI 30-39.9)     Past Surgical History  Procedure Laterality Date  . Knee arthroscopy    . Cholecystectomy    . Appendectomy      Family History  Problem Relation Age of Onset  . Heart Problems      died at 80    History  Substance Use Topics  . Smoking status: Never Smoker   . Smokeless tobacco: Never Used  . Alcohol Use: No    OB History   Grav Para Term Preterm Abortions TAB SAB Ect Mult Living                  Review of Systems At least 10pt or greater review of systems completed and are negative except where specified in the HPI.  Allergies  Review of patient's allergies indicates no known allergies.  Home Medications   Current Outpatient Rx  Name  Route  Sig  Dispense  Refill  . ALPRAZolam (XANAX XR) 1 MG 24 hr tablet   Oral   Take 1 mg by mouth every  morning.         Marland Kitchen esomeprazole (NEXIUM) 40 MG capsule   Oral   Take 40 mg by mouth daily as needed. For heartburn         . meclizine (ANTIVERT) 25 MG tablet   Oral   Take 25 mg by mouth 3 (three) times daily as needed. For dizziness         . Multiple Vitamin (MULTIVITAMIN WITH MINERALS) TABS   Oral   Take 1 tablet by mouth daily.         . Multiple Vitamins-Minerals (PRESERVISION AREDS PO)   Oral   Take 1 tablet by mouth 2 (two) times daily.         . valsartan (DIOVAN) 320 MG tablet   Oral   Take 320 mg by mouth daily.           BP 122/84  Temp(Src) 97.4 F (36.3 C) (Oral)  Resp 20  SpO2 99%  Physical Exam  Nursing notes reviewed.  Electronic medical record reviewed. VITAL SIGNS:   Filed Vitals:   02/24/13 0025  BP: 122/84  Temp: 97.4 F (36.3 C)  TempSrc: Oral  Resp: 20  SpO2: 99%   CONSTITUTIONAL: Awake, oriented, appears non-toxic HENT: Atraumatic, normocephalic, oral mucosa pink and moist, airway patent. Nares patent without drainage. External ears normal. EYES: Conjunctiva clear, EOMI, PERRLA NECK: Trachea midline, non-tender, supple CARDIOVASCULAR: Normal heart rate, Normal rhythm, No murmurs, rubs, gallops PULMONARY/CHEST: Clear to auscultation, no rhonchi, wheezes, or rales. Symmetrical breath sounds. Pacer pocket appropriately tender to palpation no overlying cellulitis, erythema, no draining pus. Pendulous breasts ABDOMINAL: Non-distended, morbidly obese, soft, non-tender - no rebound or guarding.  BS normal. NEUROLOGIC: Non-focal, moving all four extremities, no gross sensory or motor deficits. EXTREMITIES: No clubbing, cyanosis. 1+ lower extremity pitting edema SKIN: Warm, Dry, No erythema, No rash  ED Course  Procedures (including critical care time)  Date: 02/24/2013  Rate: 66  Rhythm: Ventricular paced rhythm  QRS Axis: normal  Intervals: normal  ST/T Wave abnormalities: normal  Conduction Disutrbances: none  Narrative  Interpretation: unremarkable  Labs Reviewed  COMPREHENSIVE METABOLIC PANEL - Abnormal; Notable for the following:    Sodium 125 (*)    Chloride 91 (*)    GFR calc non Af Amer 62 (*)    GFR calc Af Amer 72 (*)    All other components within normal limits  PRO B NATRIURETIC PEPTIDE - Abnormal; Notable for the following:    Pro B Natriuretic peptide (BNP) 710.9 (*)    All other components within normal limits  CBC WITH DIFFERENTIAL  POCT I-STAT TROPONIN I   Dg Chest 2 View  02/24/2013  *RADIOLOGY REPORT*  Clinical Data: 79 year old female with shortness of breath.  Recent pacemaker placement.  CHEST - 2 VIEW  Comparison: 02/20/2013 and earlier.  Findings: Semi upright AP and lateral views of the chest at 0130 hours.  Stable left chest dual lead cardiac pacemaker.  No pneumothorax or pulmonary edema.  No pleural effusion.  Chronic hiatal hernia re-identified.  Stable cardiac size and mediastinal contours.  No acute osseous abnormality identified.  IMPRESSION: Stable left chest cardiac pacemaker. No acute cardiopulmonary abnormality.  Chronic hiatal hernia.   Original Report Authenticated By: Erskine Speed, M.D.      1. Dyspnea   2. Sensation of lump in throat   3. H/O anxiety disorder   4. S/P cardiac pacemaker procedure       MDM  Patient arrived with sensation of dyspnea, patient is not tachypneic, she is not tachycardic, she is not hypoxic. She does have recent pacemaker placement, evaluated for acute heart failure, no fluid in the lungs, chest x-ray is unremarkable, pacemaker leads appear to be in the correct places by x-ray. BNP is not elevated in this patient significantly, EKG shows a ventricularly paced rhythm. Ambulate the patient she did not desaturate at all.  Give the patient some Ativan to which she responded well and her sensation of dyspnea resolved. I think this patient has a large component of anxiety. She still has a sensation that she cannot burp properly, however I do not  think this is an emergency at this time. Other labs unremarkable including a troponin which is negative, patient does have some chronic hyponatremia, I also don't think this is causing her any problems. We'll have the patient followup with her cardiologist. She can use her home prescription for Xanax for symptom control.   I explained the diagnosis and have given explicit precautions to return to the ER including shortness of breath, chest or any other new or worsening symptoms. The  patient understands and accepts the medical plan as it's been dictated and I have answered their questions. Discharge instructions concerning home care and prescriptions have been given.  The patient is STABLE and is discharged to home in good condition.         Jones Skene, MD 02/25/13 2245

## 2013-02-24 NOTE — ED Notes (Signed)
Pt ambulatory down hallway for o2 trial.  Pt tolerated well walking with assistance and o2 sats stayed around 99-100.

## 2013-02-24 NOTE — ED Notes (Signed)
Pt brought to ED via [facility].  Pt presents with shortness of breath associated with feeling like she is unable to burp for the past 2 weeks.  Denies any chest pain, pt placed on 2 L of O2 via EMS.  Pt had pacemaker placed on Monday, denies any complications with procedures.

## 2013-02-27 ENCOUNTER — Telehealth: Payer: Self-pay | Admitting: *Deleted

## 2013-02-27 ENCOUNTER — Telehealth: Payer: Self-pay | Admitting: Internal Medicine

## 2013-02-27 NOTE — Telephone Encounter (Signed)
Call from Faith, Pulmonary Director with [facility] - # [phone]  Ex [phone] Fax # [phone] Pt has home health PT orders but Rushie Goltz is requesting Skilled Nursing.  Order would be "Skilled nursing  for cardio pulmonary". Pt has another PCP listed, we only saw in hospital.  Faith will try and reach PCP for orders. Will call back if needed.

## 2013-02-27 NOTE — Telephone Encounter (Signed)
lmom for Faith to return my call

## 2013-02-27 NOTE — Telephone Encounter (Signed)
New problem   Faith/[facility] stated she called 02/22/13 and haven't heard from nurse concerning pt about skill nursing order post hospital stay. Please call Faith.

## 2013-03-01 ENCOUNTER — Encounter: Payer: Self-pay | Admitting: Internal Medicine

## 2013-03-01 ENCOUNTER — Ambulatory Visit (INDEPENDENT_AMBULATORY_CARE_PROVIDER_SITE_OTHER): Payer: Medicare Other | Admitting: Internal Medicine

## 2013-03-01 VITALS — BP 166/87 | HR 61 | Temp 96.2°F | Ht 63.0 in | Wt 180.5 lb

## 2013-03-01 DIAGNOSIS — F3289 Other specified depressive episodes: Secondary | ICD-10-CM

## 2013-03-01 DIAGNOSIS — I442 Atrioventricular block, complete: Secondary | ICD-10-CM

## 2013-03-01 DIAGNOSIS — I10 Essential (primary) hypertension: Secondary | ICD-10-CM

## 2013-03-01 DIAGNOSIS — E871 Hypo-osmolality and hyponatremia: Secondary | ICD-10-CM

## 2013-03-01 DIAGNOSIS — F329 Major depressive disorder, single episode, unspecified: Secondary | ICD-10-CM

## 2013-03-01 HISTORY — DX: Hypercalcemia: E83.52

## 2013-03-01 LAB — BASIC METABOLIC PANEL
CO2: 31 mEq/L (ref 19–32)
Calcium: 11 mg/dL — ABNORMAL HIGH (ref 8.4–10.5)
Creat: 0.89 mg/dL (ref 0.50–1.10)
Glucose, Bld: 93 mg/dL (ref 70–99)

## 2013-03-01 NOTE — Progress Notes (Signed)
Patient: Tonya Maddox   MRN: [id number]  DOB: 1927/12/18  PCP: Devra Dopp, MD   Subjective:    HPI: Tonya Maddox is a 79 y.o. female with a PMHx as outlined below, who presented to clinic today for a 1 time visit for the following:  1) Hospital follow-up - Patient was hospitalized at [facility] from April 6 - 8, 2014 for evaluation of the following:  Symptomatic complete heart block - pt was admitted with symptoms of symptomatic bradycardia (including SOB, dizziness, syncope, CP x 2 weeks). She was noted to have 3rd degree complete heart block and alternately Mobitz II - despite not being on any baseline. Cardiology consult was placed and patient is s/p dual chamber permanent pacemaker on 02/18/2013. Cardiac dysfunction is suspected to be 2/2 cardiac fibrosis affecting conduction system. She is scheduled to be evaluated by her cardiologist and is pending follow-up. Today, patient denies CP, recurrent pre-syncope, lightheadedness.   ER Followup - She did have increased shortness of breath and lower extremity edema without orthopnea/PND on 4/11, causing ER eval on 04/12. It was thought that her anxiety was causing SOB - therefore, she was given Ativan x 1 and Lasix 20mg  IV x 1.   HTN - Patient does not check blood pressure regularly at home. Currently taking Losartan 320 mg daily, and was restarted on Maxide 37.5-25mg  during ED visit 4/12. She was stopped of her nifedipine and maxide during the hospital course secondary to significant hyponatremia. denies headaches, dizziness, lightheadedness, chest pain, shortness of breath.  Hyponatremia - patient's brother indicates that she has chronic hyponatremia related to medication and complete avoidance of salt in her diet as able. States that this is a chronic issue, although he does not know her baseline levels. The patient denies any nausea, vomiting, diarrhea, abdominal pain.   Review of Systems: Per HPI.    Medication Sig  . ALPRAZolam (XANAX XR)  1 MG 24 hr tablet Take 1 mg by mouth every morning.  Marland Kitchen esomeprazole (NEXIUM) 40 MG capsule Take 40 mg by mouth daily as needed. For heartburn  . meclizine (ANTIVERT) 25 MG tablet Take 25 mg by mouth 3 (three) times daily as needed. For dizziness  . Multiple Vitamin (MULTIVITAMIN WITH MINERALS) TABS Take 1 tablet by mouth daily.  . Multiple Vitamins-Minerals (PRESERVISION AREDS PO) Take 1 tablet by mouth 2 (two) times daily.  . valsartan (DIOVAN) 320 MG tablet Take 320 mg by mouth daily.  . Maxide 37.5-25mg  tablet Take 1 tablet by mouth daily.    Allergies: No Known Allergies   Past Medical History  Diagnosis Date  . Depression   . Hypertension   . Vertigo   . GERD (gastroesophageal reflux disease)   . Arrhythmia   . Anxiety   . Syncope     2005  . Bradycardia     2004  . Arthritis     knees and right hip  . Hard of hearing   . Obesity (BMI 30-39.9)     Objective:    Physical Exam: Filed Vitals:   03/01/13 1026  BP: 166/87  Pulse: 61  Temp: 96.2 F (35.7 C)     General: Vital signs reviewed and noted. Well-developed, well-nourished, in no acute distress; alert, appropriate and cooperative throughout examination.  Head: Normocephalic, atraumatic.  Lungs:  Normal respiratory effort. Clear to auscultation BL without crackles or wheezes.  Heart: RRR. S1 and S2 normal without gallop, rubs. No murmur. Chest wall appears to be healing well.  Abdomen:  BS normoactive. Soft, Nondistended, non-tender.  No masses or organomegaly.  Extremities: 1+ pretibial edema without associated erythema, warmth.    Assessment/ Plan:   The patient's case and plan of care was discussed with attending physician, Dr. Debe Coder.

## 2013-03-01 NOTE — Patient Instructions (Addendum)
General Instructions:  Please follow-up at with your primary care doctor within 2 weeks, at which time we will reevaluate your low sodium and lower leg swelling (you will need labs at that time) - OR, please follow-up in the clinic sooner if needed.  There have been changes in your medications:  STOP your Maxide until you see your doctor  You are getting labs today, if they are abnormal I will give you a call.   If symptoms worsen, or new symptoms arise, please call the clinic or go to the ER.  PLEASE BRING ALL OF YOUR MEDICATIONS  IN A BAG TO YOUR NEXT APPOINTMENT

## 2013-03-01 NOTE — Assessment & Plan Note (Addendum)
Pertinent Data: Basic Metabolic Panel:    Component Value Date/Time   NA 134* 03/01/2013 1147   K 3.6 03/01/2013 1147   CL 92* 03/01/2013 1147   CO2 31 03/01/2013 1147   BUN 14 03/01/2013 1147   CREATININE 0.89 03/01/2013 1147   CREATININE 0.85 02/24/2013 0027   GLUCOSE 93 03/01/2013 1147   CALCIUM 11.0* 03/01/2013 1147    Assessment: The patient was noted to have very mild hypercalcemia on her repeat metabolic panel today. She is currently on multivitamins, therefore, I will discontinue this medication for now.  Plan:      Repeat BMET in 2 weeks per PCP - pt informed to return to our clinic if PCP office is unable to accommodate this appointment time.  The patient was called and informed of the above mentioned lab results and instructed to stop her multivitamin for now.  If persistent hypercalcemia during followup, may consider discontinuation of thiazide diuretic.

## 2013-03-01 NOTE — Assessment & Plan Note (Signed)
Assessment: The patient is status post pacemaker placement on 02/18/2013 for symptomatic complete heart block. The patient indicates that she is feeling well at this point. She is scheduled with her cardiologist, Dr. Donnie Aho, and Dr. Ladona Ridgel for device check.  Plan:      Continue regular followup with cardiologist as listed above.

## 2013-03-01 NOTE — Assessment & Plan Note (Signed)
Assessment:  Pt is currently taking Xanax 1mg  qAM and is not controlled on this medication - is still having a lot of anxiety. Denies SI/HI. The patient may benefit from initiation of a SSRI for better control of her depression versus generalized anxiety disorder. She is seeing is only for a one-time visit, therefore I will not make any changes today, I would not be able to monitor response to therapy.  Plan:  Continue current regimen.  Followup with her PCP, with consideration to start SSRI.

## 2013-03-01 NOTE — Assessment & Plan Note (Addendum)
ADDENDUM TO PLAN AFTER LABS RESULTED: 03/01/2013, 5:37 PM  Pertinent Labs: Basic Metabolic Panel:    Component Value Date/Time   NA 134* 03/01/2013 1147   K 3.6 03/01/2013 1147   CL 92* 03/01/2013 1147   CO2 31 03/01/2013 1147   BUN 14 03/01/2013 1147   CREATININE 0.89 03/01/2013 1147   CREATININE 0.85 02/24/2013 0027   GLUCOSE 93 03/01/2013 1147   CALCIUM 11.0* 03/01/2013 1147   Assessment: The patient was noted to have significant acute on chronic hyponatremia during the hospital course with a nadir of 119, and prior baseline between high 120s to 130s. This was a treated thiazide diuretic usage in addition to decreased oral sodium intake. She was initially held of her Maxide during the hospital course and at discharge, which was then resumed in the ER on 4/12 when the patient complained of worsening LE edema.   I obtained a STAT BMET that showed stabilization of her Na, despite taking daily Maxide. Therefore, the patient was called and informed of result, and asked to continue Maxide for now, with continued outpatient followup by her PCP.  Plan:      Continue Maxide.  Recheck BMET in 2 weeks.  Further workup by PCP.

## 2013-03-01 NOTE — Assessment & Plan Note (Signed)
Pertinent Data: BP Readings from Last 3 Encounters:  03/01/13 166/87  02/24/13 112/52  02/20/13 146/74    Basic Metabolic Panel:    Component Value Date/Time   NA 134* 03/01/2013 1147   K 3.6 03/01/2013 1147   CL 92* 03/01/2013 1147   CO2 31 03/01/2013 1147   BUN 14 03/01/2013 1147   CREATININE 0.89 03/01/2013 1147   CREATININE 0.85 02/24/2013 0027   GLUCOSE 93 03/01/2013 1147   CALCIUM 11.0* 03/01/2013 1147    Assessment: Disease Control:  Uncontrolled - however, I think this was related to her long transport from parking  Progress toward goals:  Deteriorated  Barriers to meeting goals: no barriers identified    The patient is now taking Diovan and triamterene-hydrochlorothiazide. She was stopped of the triamterene-hydrochlorothiazide during the hospital course and at discharge on 4/6. However, then on ER followup on 4/12.    Patient is compliant most of the time with prescribed medications.   Plan:  continue current medications  Initially, I instructed the patient to stop taking her Maxide until I retrieved her BMET results.  I then called the patient at 5:35 PM and informed her that Na is stable and improved at 134 despite taking Maxide daily - therefore, she can continue to take this medication. Patient expressed understanding.  Educational resources provided:    Self management tools provided:

## 2013-03-05 ENCOUNTER — Ambulatory Visit (INDEPENDENT_AMBULATORY_CARE_PROVIDER_SITE_OTHER): Payer: Medicare Other | Admitting: *Deleted

## 2013-03-05 ENCOUNTER — Encounter: Payer: Self-pay | Admitting: *Deleted

## 2013-03-05 ENCOUNTER — Other Ambulatory Visit: Payer: Self-pay | Admitting: Internal Medicine

## 2013-03-05 DIAGNOSIS — I498 Other specified cardiac arrhythmias: Secondary | ICD-10-CM

## 2013-03-05 DIAGNOSIS — R001 Bradycardia, unspecified: Secondary | ICD-10-CM

## 2013-03-05 NOTE — Progress Notes (Signed)
Wound check ppm in clinic. Battery longevity 7 years. Normal device function. Site well healed with no swelling or redness. ROV in 3 mths with GT.

## 2013-03-06 ENCOUNTER — Encounter: Payer: Self-pay | Admitting: Cardiology

## 2013-03-06 LAB — PACEMAKER DEVICE OBSERVATION
AL AMPLITUDE: 5.6 mv
AL IMPEDENCE PM: 631 Ohm
BATTERY VOLTAGE: 2.79 V
RV LEAD AMPLITUDE: 15.68 mv

## 2013-03-06 NOTE — Progress Notes (Signed)
Patient ID: Tonya Maddox, female   DOB: Jan 18, 1928, 79 y.o.   MRN: [id number]  Tea, Collums  Date of visit:  03/06/2013 DOB:  [birth year]    Age:  79 yrs. Medical record number:  [id number]     Account number:  [id number] Primary Care Provider: Devra Dopp ____________________________ CURRENT DIAGNOSES  1. Hypertension-Essential (Benign)  2. Pacemaker/cardiac insitu  3. Obesity(BMI30-40)  4. Anxiety Disorder Nos  5. GERD ____________________________ ALLERGIES  Aspirin  atenolol, Bradycardia ____________________________ MEDICATIONS  1. multivitamin tablet, 1 p.o. q.d.  2. Nexium 40 mg capsule,delayed release(DR/EC), 1 p.o. q.d.  3. Ocuvite 150-30-6-150 mg-unit-mg-mg capsule, 2 bid  4. Xanax 1 mg tablet, 1 p.o. q.d.  5. meclizine 25 mg tablet, PRN  6. triamterene-hydrochlorothiazid 37.5-25 mg tablet, 1 p.o. daily  7. valsartan 320 mg tablet, 1/2 tab daily ____________________________ CHIEF COMPLAINTS  Followup of Hypertension-Essential (Benign)  Followup of Pacemaker/cardiac insitu ____________________________ HISTORY OF PRESENT ILLNESS  Patient seen for cardiac followup. She was admitted to the hospital recently with significant bradycardia and had a pacemaker implanted. She was found to be chronically hyponatremic and her blood pressure was somewhat low. While in the hospital her nifedipine was discontinued and her HCTZ was discontinued because of hyponatremia. She was c and the emergency room with edema and given a one-time shot of furosemide. She has since been followed in the internal medicine clinic. She complains of weakness and says that she has difficulty walking still and getting up and down. She does not have angina and has no PND or orthopnea. ____________________________ PAST HISTORY  Past Medical Illnesses:  hypertension, obesity, hyponatremia;  Cardiovascular Illnesses:  complete heart block 2014;  Surgical Procedures:  appendectomy, cholecystectomy, pacemaker implant;   Cardiology Procedures-Invasive:  cardiac cath (left) January 2004, Medtronic pacemaker implant April 2014;  Cardiology Procedures-Noninvasive:  echocardiogram April 2014;  Cardiac Cath Results:  no significant disease;  LVEF of 70% documented via echocardiogram on 02/19/2013,   ____________________________ CARDIO-PULMONARY TEST DATES EKG Date:  03/06/2013;   Cardiac Cath Date:  11/29/2002;  Echocardiography Date: 02/19/2013;  Chest Xray Date: 02/20/2013;   ____________________________ SOCIAL HISTORY Alcohol Use:  no alcohol use;  Smoking:  never smoked;  Diet:  no added salt;  Lifestyle:  single;  Exercise:  some exercise and walking;  Occupation:  retired and Designer, fashion/clothing;   ____________________________ REVIEW OF SYSTEMS General:  obesity Eyes: denies diplopia, history of glaucoma or visual field defects., wears eye glasses/contact lenses Ears, Nose, Throat, Mouth:  vertigo unrelated to position Respiratory: denies dyspnea, cough, wheezing or hemoptysis. Cardiovascular:  please review HPI Abdominal: hiatal hernia and intermittent dyspepsia Musculoskeletal:  arthritis of the left knee Psychiatric:  anxiety, depression  ____________________________ PHYSICAL EXAMINATION VITAL SIGNS  Blood Pressure:  110/64 Sitting, Left arm, large cuff  , 114/62 Standing, Left arm and large cuff   Pulse:  70/min. Weight:  180.00 lbs. Height:  60.5"BMI: 34  Constitutional:  pleasant white female, in no acute distress Skin:  warm and dry to touch, no apparent skin lesions, or masses noted. Head:  normocephalic, normal hair pattern, no masses or tenderness Neck:  no palpable masses or adenopathy, no thyromegaly, no JVD, carotid pulses are full and equal bilaterally without bruits. Chest:  clear to auscultation and percussion, healed pacemaker incision in the left pectoral area Cardiac:  regular rhythm, normal S1 and S2, No S3 or S4, no murmurs, gallops or rubs detected. Peripheral Pulses:  the femoral,dorsalis pedis,  and posterior tibial pulses are full and equal bilaterally with  no bruits auscultated. Extremities & Back:  no deformities,clubbing, erythema or edema observed ____________________________ IMPRESSIONS/PLAN  1. Recent pacemaker insertion for second degree heart block and complete heart block with sinus bradycardia 2. Hypertensive heart disease 3. Previous hyponatremia 4. Obesity with need to lose weight  Recommendations:  At this point in time she should not take HCTZ. Her blood pressure was low in the office and asked her to reduce her valsartan to 160 mg daily. If she does have edema of furosemide would be a choice would be better for her since it does not cause hyponatremia to the degree that HCTZ does. I will see her in followup in 3 months. We are in the process of arranging remote pacemaker followup. ____________________________ TODAYS ORDERS  1. 12 Lead EKG: Today  2. Return Visit: 3 months                       ____________________________ Cardiology Physician:  Darden Palmer MD [facility]

## 2013-03-08 NOTE — Telephone Encounter (Signed)
Order to Dr Donnie Aho her primary Cardilogist

## 2013-03-19 NOTE — Progress Notes (Signed)
INTERNAL MEDICINE TEACHING ATTENDING ADDENDUM - Inez Catalina, MD: I reviewed with the resident Dr. Saralyn Pilar, Ms. Battin's  medical history, physical examination, diagnosis and results of tests and treatment and I agree with the patient's care as documented.

## 2013-05-29 ENCOUNTER — Ambulatory Visit (INDEPENDENT_AMBULATORY_CARE_PROVIDER_SITE_OTHER): Payer: Medicare Other | Admitting: Internal Medicine

## 2013-05-29 ENCOUNTER — Encounter: Payer: Self-pay | Admitting: Internal Medicine

## 2013-05-29 VITALS — BP 140/84 | HR 68 | Ht 62.0 in | Wt 177.0 lb

## 2013-05-29 DIAGNOSIS — I10 Essential (primary) hypertension: Secondary | ICD-10-CM

## 2013-05-29 DIAGNOSIS — Z950 Presence of cardiac pacemaker: Secondary | ICD-10-CM

## 2013-05-29 DIAGNOSIS — R001 Bradycardia, unspecified: Secondary | ICD-10-CM

## 2013-05-29 DIAGNOSIS — I498 Other specified cardiac arrhythmias: Secondary | ICD-10-CM

## 2013-05-29 LAB — PACEMAKER DEVICE OBSERVATION
BAMS-0001: 150 {beats}/min
BATTERY VOLTAGE: 2.79 V
RV LEAD AMPLITUDE: 15 mv
VENTRICULAR PACING PM: 100

## 2013-05-29 NOTE — Assessment & Plan Note (Signed)
Her blood pressure is slightly elevated. She admits to sodium indiscretion and I have asked her to reduce her salt intake.

## 2013-05-29 NOTE — Patient Instructions (Addendum)
Your physician wants you to follow-up in: April 2015 with Dr Court Joy will receive a reminder letter in the mail two months in advance. If you don't receive a letter, please call our office to schedule the follow-up appointment.

## 2013-05-29 NOTE — Progress Notes (Signed)
HPI Tonya Maddox returns today for followup. Tonya Maddox is a pleaant 79 yo woman with a h/o symptomatic bradycardia, s/p PPM insertion. In the interim Tonya Maddox has done well with no syncope. No chest pain or sob. No edema.  Allergies  Allergen Reactions  . Aleve (Naproxen Sodium)     "Made her feel like Tonya Maddox was floating for days"  . Aspirin     "Made stomach burn"     Current Outpatient Prescriptions  Medication Sig Dispense Refill  . acetaminophen (TYLENOL) 325 MG tablet Take 650 mg by mouth every 6 (six) hours as needed for pain. Take one tablet as needed for arthritis pain      . ALPRAZolam (XANAX XR) 1 MG 24 hr tablet Take 1 mg by mouth every morning.      Marland Kitchen esomeprazole (NEXIUM) 40 MG capsule Take 40 mg by mouth daily as needed. For heartburn      . fexofenadine (ALLEGRA) 60 MG tablet Take 60 mg by mouth daily. PT UNSURE OF STRENGTH. Will call us to let us know.      . meclizine (ANTIVERT) 25 MG tablet Take 25 mg by mouth 3 (three) times daily as needed. For dizziness      . Multiple Vitamins-Minerals (PRESERVISION AREDS PO) Take 1 tablet by mouth 2 (two) times daily.      Marland Kitchen triamterene-hydrochlorothiazide (DYAZIDE) 37.5-25 MG per capsule Take 1 capsule by mouth every morning.      . valsartan (DIOVAN) 160 MG tablet Take 160 mg by mouth daily.       No current facility-administered medications for this visit.     Past Medical History  Diagnosis Date  . Depression   . Hypertension   . Vertigo   . GERD (gastroesophageal reflux disease)   . Arrhythmia   . Anxiety   . Syncope     2005  . Bradycardia     2004  . Arthritis     knees and right hip  . Hard of hearing   . Obesity (BMI 30-39.9)     ROS:   All systems reviewed and negative except as noted in the HPI.   Past Surgical History  Procedure Laterality Date  . Knee arthroscopy    . Cholecystectomy    . Appendectomy       Family History  Problem Relation Age of Onset  . Heart Problems      died at 57     History    Social History  . Marital Status: Single    Spouse Name: N/A    Number of Children: N/A  . Years of Education: N/A   Occupational History  . Not on file.   Social History Main Topics  . Smoking status: Never Smoker   . Smokeless tobacco: Never Used  . Alcohol Use: No  . Drug Use: No  . Sexually Active: No   Other Topics Concern  . Not on file   Social History Narrative   Lives with brother   Used to work in Designer, fashion/clothing   Denies cigarettes, alcohol, other drugs      BP 140/84  Pulse 68  Ht 5\' 2"  (1.575 m)  Wt 177 lb (80.287 kg)  BMI 32.37 kg/m2  Physical Exam:  Well appearing NAD HEENT: Unremarkable Neck:  No JVD, no thyromegally Lymphatics:  No adenopathy Back:  No CVA tenderness Lungs:  Clear HEART:  Regular rate rhythm, no murmurs, no rubs, no clicks Abd:  soft, positive bowel sounds, no organomegally,  no rebound, no guarding Ext:  2 plus pulses, no edema, no cyanosis, no clubbing Skin:  No rashes no nodules Neuro:  CN II through XII intact, motor grossly intact  EKG  DEVICE  Normal device function.  See PaceArt for details.   Assess/Plan:

## 2013-05-29 NOTE — Assessment & Plan Note (Signed)
Her Medtronic device is working normally. Will plan to recheck in several months.

## 2014-02-19 ENCOUNTER — Encounter: Payer: Self-pay | Admitting: *Deleted

## 2014-02-26 ENCOUNTER — Encounter: Payer: Medicare Other | Admitting: Internal Medicine

## 2014-02-27 ENCOUNTER — Encounter: Payer: Self-pay | Admitting: Internal Medicine

## 2014-03-14 ENCOUNTER — Ambulatory Visit (INDEPENDENT_AMBULATORY_CARE_PROVIDER_SITE_OTHER): Payer: Medicare Other | Admitting: *Deleted

## 2014-03-14 ENCOUNTER — Encounter: Payer: Self-pay | Admitting: Internal Medicine

## 2014-03-14 DIAGNOSIS — I498 Other specified cardiac arrhythmias: Secondary | ICD-10-CM

## 2014-03-14 DIAGNOSIS — R001 Bradycardia, unspecified: Secondary | ICD-10-CM

## 2014-03-14 LAB — MDC_IDC_ENUM_SESS_TYPE_INCLINIC
Battery Impedance: 121 Ohm
Battery Remaining Longevity: 81 mo
Battery Voltage: 2.79 V
Brady Statistic AP VP Percent: 93 %
Brady Statistic AS VS Percent: 0 %
Lead Channel Impedance Value: 574 Ohm
Lead Channel Impedance Value: 596 Ohm
Lead Channel Sensing Intrinsic Amplitude: 2 mV
Lead Channel Setting Pacing Amplitude: 2 V
MDC IDC MSMT LEADCHNL RA PACING THRESHOLD AMPLITUDE: 0.75 V
MDC IDC MSMT LEADCHNL RA PACING THRESHOLD PULSEWIDTH: 0.4 ms
MDC IDC MSMT LEADCHNL RV PACING THRESHOLD AMPLITUDE: 1.5 V
MDC IDC MSMT LEADCHNL RV PACING THRESHOLD PULSEWIDTH: 0.64 ms
MDC IDC MSMT LEADCHNL RV SENSING INTR AMPL: 5.6 mV
MDC IDC SESS DTM: 20150430160653
MDC IDC SET LEADCHNL RV PACING AMPLITUDE: 2.5 V
MDC IDC SET LEADCHNL RV PACING PULSEWIDTH: 0.64 ms
MDC IDC SET LEADCHNL RV SENSING SENSITIVITY: 2 mV
MDC IDC STAT BRADY AP VS PERCENT: 0 %
MDC IDC STAT BRADY AS VP PERCENT: 7 %

## 2014-03-14 NOTE — Progress Notes (Signed)
Pacemaker check in clinic. Normal device function. Thresholds, sensing, impedances consistent with previous measurements. An increase in the ventricular threshold was noted and the ventricular PW was reprogrammed.  There is a decrease in the R-waves from 15mV in July 2014 to 5.6-8.510mV today.  Device programmed to maximize longevity. 53 mode switches, <0.1%.  No high ventricular rates noted. Device programmed at appropriate safety margins. Histogram distribution appropriate for patient activity level. Device programmed to optimize intrinsic conduction. Estimated longevity 6.5 years.  Patient education completed.  ROV in July with Dr. Ladona Ridgelaylor.

## 2014-05-01 ENCOUNTER — Ambulatory Visit: Payer: Medicare Other | Attending: Specialist | Admitting: Physical Therapy

## 2014-05-01 DIAGNOSIS — M25569 Pain in unspecified knee: Secondary | ICD-10-CM | POA: Insufficient documentation

## 2014-05-01 DIAGNOSIS — M25519 Pain in unspecified shoulder: Secondary | ICD-10-CM | POA: Insufficient documentation

## 2014-05-01 DIAGNOSIS — M545 Low back pain, unspecified: Secondary | ICD-10-CM | POA: Insufficient documentation

## 2014-05-01 DIAGNOSIS — IMO0001 Reserved for inherently not codable concepts without codable children: Secondary | ICD-10-CM | POA: Insufficient documentation

## 2014-05-01 DIAGNOSIS — R262 Difficulty in walking, not elsewhere classified: Secondary | ICD-10-CM | POA: Insufficient documentation

## 2014-05-09 ENCOUNTER — Ambulatory Visit: Payer: Medicare Other | Admitting: Physical Therapy

## 2014-07-02 ENCOUNTER — Encounter: Payer: Self-pay | Admitting: Internal Medicine

## 2014-07-02 ENCOUNTER — Ambulatory Visit (INDEPENDENT_AMBULATORY_CARE_PROVIDER_SITE_OTHER): Payer: Medicare Other | Admitting: Internal Medicine

## 2014-07-02 VITALS — BP 110/80 | HR 80 | Ht 63.0 in | Wt 180.0 lb

## 2014-07-02 DIAGNOSIS — I498 Other specified cardiac arrhythmias: Secondary | ICD-10-CM

## 2014-07-02 DIAGNOSIS — I10 Essential (primary) hypertension: Secondary | ICD-10-CM

## 2014-07-02 DIAGNOSIS — I442 Atrioventricular block, complete: Secondary | ICD-10-CM

## 2014-07-02 DIAGNOSIS — R001 Bradycardia, unspecified: Secondary | ICD-10-CM

## 2014-07-02 DIAGNOSIS — Z950 Presence of cardiac pacemaker: Secondary | ICD-10-CM

## 2014-07-02 LAB — MDC_IDC_ENUM_SESS_TYPE_INCLINIC
Battery Impedance: 169 Ohm
Battery Voltage: 2.78 V
Brady Statistic AP VP Percent: 91 %
Brady Statistic AP VS Percent: 0 %
Brady Statistic AS VP Percent: 9 %
Lead Channel Impedance Value: 556 Ohm
Lead Channel Impedance Value: 614 Ohm
Lead Channel Pacing Threshold Amplitude: 0.75 V
Lead Channel Pacing Threshold Pulse Width: 0.4 ms
Lead Channel Setting Pacing Amplitude: 2 V
Lead Channel Setting Pacing Amplitude: 3.5 V
Lead Channel Setting Sensing Sensitivity: 2 mV
MDC IDC MSMT BATTERY REMAINING LONGEVITY: 81 mo
MDC IDC MSMT LEADCHNL RV PACING THRESHOLD AMPLITUDE: 1.5 V
MDC IDC MSMT LEADCHNL RV PACING THRESHOLD PULSEWIDTH: 1 ms
MDC IDC MSMT LEADCHNL RV SENSING INTR AMPL: 4 mV
MDC IDC SESS DTM: 20150818121532
MDC IDC SET LEADCHNL RV PACING PULSEWIDTH: 0.64 ms
MDC IDC STAT BRADY AS VS PERCENT: 0 %

## 2014-07-02 NOTE — Patient Instructions (Signed)
Remote monitoring is used to monitor your Pacemaker of ICD from home. This monitoring reduces the number of office visits required to check your device to one time per year. It allows us to keep an eye on the functioning of your device to ensure it is working properly. You are scheduled for a device check from home on 10/01/2014. You may send your transmission at any time that day. If you have a wireless device, the transmission will be sent automatically. After your physician reviews your transmission, you will receive a postcard with your next transmission date.  Your physician recommends that you schedule a follow-up appointment as needed with Dr. Ladona Ridgelaylor

## 2014-07-02 NOTE — Progress Notes (Signed)
HPI Tonya Maddox returns today for followup. She is a pleaant 80 yo woman with a h/o symptomatic bradycardia due to sinus node dysfunction, s/p PPM insertion. In the interim she has done well with no syncope. No chest pain or sob. No edema. She is frustrated by her inability to lose weight. No syncope. Allergies  Allergen Reactions  . Aleve [Naproxen Sodium]     "Made her feel like she was floating for days"  . Aspirin     "Made stomach burn"  . Terazosin Other (See Comments)    Patient states in causes myalgia     Current Outpatient Prescriptions  Medication Sig Dispense Refill  . acetaminophen (TYLENOL) 325 MG tablet Take 650 mg by mouth every 6 (six) hours as needed for pain. Take one tablet as needed for arthritis pain      . ALPRAZolam (XANAX XR) 1 MG 24 hr tablet Take 1 mg by mouth every morning.      Marland Kitchen. amLODipine (NORVASC) 5 MG tablet Take 1 tablet by mouth daily.      Marland Kitchen. esomeprazole (NEXIUM) 40 MG capsule Take 40 mg by mouth daily as needed. For heartburn      . fexofenadine (ALLEGRA) 60 MG tablet Take 60 mg by mouth as needed. PT UNSURE OF STRENGTH. Will call us to let us know.      . meclizine (ANTIVERT) 25 MG tablet Take 25 mg by mouth 3 (three) times daily as needed. For dizziness      . meloxicam (MOBIC) 7.5 MG tablet as needed.      . triamterene-hydrochlorothiazide (DYAZIDE) 37.5-25 MG per capsule Take 1 capsule by mouth every morning.      . valsartan (DIOVAN) 320 MG tablet Take 1 tablet by mouth daily.       No current facility-administered medications for this visit.     Past Medical History  Diagnosis Date  . Depression   . Hypertension   . Vertigo   . GERD (gastroesophageal reflux disease)   . Arrhythmia   . Anxiety   . Syncope     2005  . Bradycardia     2004  . Arthritis     knees and right hip  . Hard of hearing   . Obesity (BMI 30-39.9)     ROS:   All systems reviewed and negative except as noted in the HPI.   Past Surgical History  Procedure  Laterality Date  . Knee arthroscopy    . Cholecystectomy    . Appendectomy       Family History  Problem Relation Age of Onset  . Heart Problems      died at 6366     History   Social History  . Marital Status: Single    Spouse Name: N/A    Number of Children: N/A  . Years of Education: N/A   Occupational History  . Not on file.   Social History Main Topics  . Smoking status: Never Smoker   . Smokeless tobacco: Never Used  . Alcohol Use: No  . Drug Use: No  . Sexual Activity: No   Other Topics Concern  . Not on file   Social History Narrative   Lives with brother   Used to work in Designer, fashion/clothingtextiles   Denies cigarettes, alcohol, other drugs      BP 110/80  Pulse 80  Ht 5\' 3"  (1.6 m)  Wt 180 lb (81.647 kg)  BMI 31.89 kg/m2  SpO2 97%  Physical Exam:  Well appearing but overweight elderly woman, NAD HEENT: Unremarkable Neck:  No JVD, no thyromegally Back:  No CVA tenderness Lungs:  Clear with no wheezes HEART:  Regular rate rhythm, soft systolic murmurs, no rubs, no clicks Abd:  soft, positive bowel sounds, no organomegally, no rebound, no guarding Ext:  2 plus pulses, no edema, no cyanosis, no clubbing Skin:  No rashes no nodules Neuro:  CN II through XII intact, motor grossly intact   DEVICE  Normal device function.  See PaceArt for details.   Assess/Plan:

## 2014-07-02 NOTE — Assessment & Plan Note (Signed)
Her blood pressure is well controlled today. No change in her meds.

## 2014-07-02 NOTE — Assessment & Plan Note (Signed)
Her Medtronic DDD PM is working normally except for mild elevation in her RV threshold. We have reprogrammed her today.

## 2014-07-03 ENCOUNTER — Encounter: Payer: Self-pay | Admitting: Internal Medicine

## 2014-10-24 ENCOUNTER — Encounter (HOSPITAL_COMMUNITY): Payer: Self-pay | Admitting: Internal Medicine

## 2016-04-06 ENCOUNTER — Emergency Department (HOSPITAL_COMMUNITY): Payer: Medicare Other

## 2016-04-06 ENCOUNTER — Encounter (HOSPITAL_COMMUNITY): Payer: Self-pay | Admitting: Emergency Medicine

## 2016-04-06 ENCOUNTER — Observation Stay (HOSPITAL_COMMUNITY)
Admission: EM | Admit: 2016-04-06 | Discharge: 2016-04-07 | Disposition: A | Discharge status: Home / Self Care | Payer: Medicare Other | Attending: Family Medicine | Admitting: Family Medicine

## 2016-04-06 DIAGNOSIS — I10 Essential (primary) hypertension: Secondary | ICD-10-CM | POA: Diagnosis not present

## 2016-04-06 DIAGNOSIS — F419 Anxiety disorder, unspecified: Secondary | ICD-10-CM | POA: Diagnosis present

## 2016-04-06 DIAGNOSIS — K219 Gastro-esophageal reflux disease without esophagitis: Secondary | ICD-10-CM | POA: Diagnosis not present

## 2016-04-06 DIAGNOSIS — I4891 Unspecified atrial fibrillation: Secondary | ICD-10-CM | POA: Diagnosis not present

## 2016-04-06 DIAGNOSIS — R0789 Other chest pain: Principal | ICD-10-CM | POA: Insufficient documentation

## 2016-04-06 DIAGNOSIS — G8929 Other chronic pain: Secondary | ICD-10-CM | POA: Diagnosis not present

## 2016-04-06 DIAGNOSIS — E871 Hypo-osmolality and hyponatremia: Secondary | ICD-10-CM | POA: Diagnosis not present

## 2016-04-06 DIAGNOSIS — K449 Diaphragmatic hernia without obstruction or gangrene: Secondary | ICD-10-CM | POA: Diagnosis not present

## 2016-04-06 DIAGNOSIS — F329 Major depressive disorder, single episode, unspecified: Secondary | ICD-10-CM | POA: Insufficient documentation

## 2016-04-06 DIAGNOSIS — Z7901 Long term (current) use of anticoagulants: Secondary | ICD-10-CM

## 2016-04-06 DIAGNOSIS — R079 Chest pain, unspecified: Secondary | ICD-10-CM | POA: Diagnosis present

## 2016-04-06 DIAGNOSIS — Z79899 Other long term (current) drug therapy: Secondary | ICD-10-CM | POA: Insufficient documentation

## 2016-04-06 DIAGNOSIS — M1990 Unspecified osteoarthritis, unspecified site: Secondary | ICD-10-CM | POA: Diagnosis not present

## 2016-04-06 DIAGNOSIS — Z950 Presence of cardiac pacemaker: Secondary | ICD-10-CM | POA: Diagnosis not present

## 2016-04-06 DIAGNOSIS — I4892 Unspecified atrial flutter: Secondary | ICD-10-CM | POA: Diagnosis present

## 2016-04-06 LAB — BASIC METABOLIC PANEL
ANION GAP: 8 (ref 5–15)
BUN: 22 mg/dL — AB (ref 6–20)
CO2: 26 mmol/L (ref 22–32)
Calcium: 9.2 mg/dL (ref 8.9–10.3)
Chloride: 95 mmol/L — ABNORMAL LOW (ref 101–111)
Creatinine, Ser: 0.93 mg/dL (ref 0.44–1.00)
GFR, EST NON AFRICAN AMERICAN: 54 mL/min — AB (ref >60)
Glucose, Bld: 86 mg/dL (ref 65–99)
POTASSIUM: 4.9 mmol/L (ref 3.5–5.1)
SODIUM: 129 mmol/L — AB (ref 135–145)

## 2016-04-06 LAB — CBC
HEMATOCRIT: 43.1 % (ref 36.0–46.0)
HEMOGLOBIN: 14.4 g/dL (ref 12.0–15.0)
MCH: 31.2 pg (ref 26.0–34.0)
MCHC: 33.4 g/dL (ref 30.0–36.0)
MCV: 93.3 fL (ref 78.0–100.0)
Platelets: 225 10*3/uL (ref 150–400)
RBC: 4.62 MIL/uL (ref 3.87–5.11)
RDW: 13.5 % (ref 11.5–15.5)
WBC: 6 10*3/uL (ref 4.0–10.5)

## 2016-04-06 LAB — I-STAT TROPONIN, ED
TROPONIN I, POC: 0 ng/mL (ref 0.00–0.08)
Troponin i, poc: 0 ng/mL (ref 0.00–0.08)

## 2016-04-06 NOTE — ED Notes (Signed)
Discussed plan of care with patient and family. Followed up with Lowella BandyNikki, PA-C and Dr. Karma GanjaLinker.

## 2016-04-06 NOTE — ED Notes (Signed)
[facility] reported chest tightness[facility] She acknowledges, gives order for ekg.

## 2016-04-06 NOTE — ED Notes (Signed)
Discussed plan of care with nikki, pa-c.

## 2016-04-06 NOTE — ED Notes (Signed)
Patient gone to xray 

## 2016-04-06 NOTE — ED Provider Notes (Signed)
CSN: [id number]     Arrival date & time 04/06/16  1918 History   First MD Initiated Contact with Patient 04/06/16 1933     Chief Complaint  Patient presents with  . Chest Pain   HPI   Tonya Maddox is a 82 y.o. female PMH significant for anxiety, HTN, GERD, a-fib (patient on eliquis), bradycardia (s/p pacemaker) presenting with chest pain since noon today. She describes the chest pain as nonexertional, intermittent, 2 out of 10 pain scale, seconds in duration, left-sided in location, not radiating. She denies fevers, chills, shortness of breath, abdominal pain, nausea, vomiting, change in bowel or bladder habits.  Past Medical History  Diagnosis Date  . Depression   . Hypertension   . Vertigo   . GERD (gastroesophageal reflux disease)   . Arrhythmia   . Anxiety   . Syncope     2005  . Bradycardia     2004  . Arthritis     knees and right hip  . Hard of hearing   . Obesity (BMI 30-39.9)    Past Surgical History  Procedure Laterality Date  . Knee arthroscopy    . Cholecystectomy    . Appendectomy    . Permanent pacemaker insertion N/A 02/19/2013    Procedure: PERMANENT PACEMAKER INSERTION;  Surgeon: Marinus Maw, MD;  Location: [facility] CATH LAB;  Service: Cardiovascular;  Laterality: N/A;   Family History  Problem Relation Age of Onset  . Heart Problems      died at 76   Social History  Substance Use Topics  . Smoking status: Never Smoker   . Smokeless tobacco: Never Used  . Alcohol Use: No   OB History    No data available     Review of Systems  Ten systems are reviewed and are negative for acute change except as noted in the HPI   Allergies  Aleve; Aspirin; and Terazosin  Home Medications   Prior to Admission medications   Medication Sig Start Date End Date Taking? Authorizing Provider  acetaminophen (TYLENOL) 325 MG tablet Take 650 mg by mouth every 6 (six) hours as needed for pain. Take one tablet as needed for arthritis pain    Historical Provider, MD   ALPRAZolam (XANAX XR) 1 MG 24 hr tablet Take 1 mg by mouth every morning.    Historical Provider, MD  amLODipine (NORVASC) 5 MG tablet Take 1 tablet by mouth daily. 06/21/14   Historical Provider, MD  esomeprazole (NEXIUM) 40 MG capsule Take 40 mg by mouth daily as needed. For heartburn    Historical Provider, MD  fexofenadine (ALLEGRA) 60 MG tablet Take 60 mg by mouth as needed. PT UNSURE OF STRENGTH. Will call us to let us know.    Historical Provider, MD  meclizine (ANTIVERT) 25 MG tablet Take 25 mg by mouth 3 (three) times daily as needed. For dizziness    Historical Provider, MD  meloxicam (MOBIC) 7.5 MG tablet as needed. 06/21/14   Historical Provider, MD  triamterene-hydrochlorothiazide (DYAZIDE) 37.5-25 MG per capsule Take 1 capsule by mouth every morning.    Historical Provider, MD  valsartan (DIOVAN) 320 MG tablet Take 1 tablet by mouth daily. 06/12/14   Historical Provider, MD   BP 131/68 mmHg  Pulse 59  Temp(Src) 97.5 F (36.4 C) (Oral)  Resp 12  Ht 5\' 1"  (1.549 m)  Wt 74.844 kg  BMI 31.19 kg/m2  SpO2 98% Physical Exam  Constitutional: She appears well-developed and well-nourished. No distress.  Obese  HENT:  Head: Normocephalic and atraumatic.  Mouth/Throat: Oropharynx is clear and moist. No oropharyngeal exudate.  Eyes: Conjunctivae are normal. Pupils are equal, round, and reactive to light. Right eye exhibits no discharge. Left eye exhibits no discharge. No scleral icterus.  Neck: No tracheal deviation present.  Cardiovascular: Normal heart sounds and intact distal pulses.  Exam reveals no gallop and no friction rub.   No murmur heard. Paced rhythm.   Pulmonary/Chest: Effort normal and breath sounds normal. No respiratory distress. She has no wheezes. She has no rales. She exhibits no tenderness.  Abdominal: Soft. Bowel sounds are normal. She exhibits no distension and no mass. There is no tenderness. There is no rebound and no guarding.  Musculoskeletal: She exhibits no  edema.  Lymphadenopathy:    She has no cervical adenopathy.  Neurological: She is alert. Coordination normal.  Skin: Skin is warm and dry. No rash noted. She is not diaphoretic. No erythema.  Psychiatric: She has a normal mood and affect. Her behavior is normal.  Nursing note and vitals reviewed.   ED Course  Procedures  Labs Review Labs Reviewed  BASIC METABOLIC PANEL - Abnormal; Notable for the following:    Sodium 129 (*)    Chloride 95 (*)    BUN 22 (*)    GFR calc non Af Amer 54 (*)    All other components within normal limits  CBC  I-STAT TROPOININ, ED    Imaging Review Dg Chest 2 View  04/06/2016  CLINICAL DATA:  Acute onset of generalized chest tightness and shortness of breath. Hand and foot tingling. Initial encounter. EXAM: CHEST  2 VIEW COMPARISON:  Chest radiograph performed 02/24/2013 FINDINGS: The lungs are well-aerated and clear. There is no evidence of focal opacification, pleural effusion or pneumothorax. The heart is mildly enlarged. A pacemaker is noted at the left chest wall, with leads ending at the right atrium and right ventricle. No acute osseous abnormalities are seen. A moderate hiatal hernia is noted. Clips are noted within the right upper quadrant, reflecting prior cholecystectomy. IMPRESSION: 1. Lungs remain grossly clear. 2. Mild cardiomegaly. 3. Moderate hiatal hernia noted. Electronically Signed   By: Roanna Raider M.D.   On: 04/06/2016 20:12   I have personally reviewed and evaluated these images and lab results as part of my medical decision-making.   EKG Interpretation   Date/Time:  Tuesday Apr 06 2016 20:56:59 EDT Ventricular Rate:  69 PR Interval:  41 QRS Duration: 128 QT Interval:  506 QTC Calculation: 542 R Axis:   -21 Text Interpretation:  Afib/flut and V-paced complexes No further analysis  attempted due to paced rhythm No significant change since last tracing  Confirmed by [facility]  MD, MARTHA [phone]) on 04/06/2016 9:18:43 PM       MDM   Final diagnoses:  Chest pain, unspecified chest pain type   Most likely etiologies are ACS vs MSK vs anxiety. Less likely are PE, PNA.  EKG paced rhythm-no significant change since last tracing. Chest x-ray unremarkable for acute change. Troponin 2 negative. BMP demonstrates hyponatremia of 129 and BUN of 22 which appears to be baseline for patient. CBC unremarkable. Patient will need to be admitted for chest pain rule out given patient's HEART score is 4. Had a very extensive conversation with patient regarding admission. Patient is concerned because she does not want to be admitted under observation because she feels that insurance will not pay for her admission. She initially wanted to leave AMA but stated there are  multiple holes in her backyard that she could trip over and that it is unsafe for her to try to get to the house by herself at night. Minerva AreolaEric, Consulting civil engineercharge RN, and I again had a very extensive conversation with patient explaining it may be beneficial to her to not only be admitted for chest pain rule out but also to wait until daylight when she can see these holes in her backyard. Patient agreed to admission, and I informed her that we would contact case management and social work to try and help with her medical bills.  Melton KrebsSamantha Nicole Darrow Barreiro, PA-C 04/07/16 0130  Jerelyn ScottMartha Linker, MD 04/07/16 [phone]

## 2016-04-06 NOTE — ED Notes (Signed)
Per EMS, the patient is from home with on and off chest pressure after cleaning. Started around noon, no meds given prior to arrival. Primary MD told patient not to take aspirin as she is on eliquis. Pacemaker in place.

## 2016-04-07 ENCOUNTER — Observation Stay (HOSPITAL_BASED_OUTPATIENT_CLINIC_OR_DEPARTMENT_OTHER): Payer: Medicare Other

## 2016-04-07 DIAGNOSIS — R079 Chest pain, unspecified: Secondary | ICD-10-CM | POA: Diagnosis not present

## 2016-04-07 DIAGNOSIS — Z7901 Long term (current) use of anticoagulants: Secondary | ICD-10-CM

## 2016-04-07 DIAGNOSIS — I4892 Unspecified atrial flutter: Secondary | ICD-10-CM | POA: Diagnosis not present

## 2016-04-07 DIAGNOSIS — K219 Gastro-esophageal reflux disease without esophagitis: Secondary | ICD-10-CM

## 2016-04-07 DIAGNOSIS — F419 Anxiety disorder, unspecified: Secondary | ICD-10-CM | POA: Diagnosis not present

## 2016-04-07 DIAGNOSIS — Z950 Presence of cardiac pacemaker: Secondary | ICD-10-CM

## 2016-04-07 DIAGNOSIS — I10 Essential (primary) hypertension: Secondary | ICD-10-CM

## 2016-04-07 DIAGNOSIS — R0789 Other chest pain: Secondary | ICD-10-CM | POA: Diagnosis not present

## 2016-04-07 DIAGNOSIS — E871 Hypo-osmolality and hyponatremia: Secondary | ICD-10-CM

## 2016-04-07 HISTORY — DX: Unspecified atrial flutter: I48.92

## 2016-04-07 HISTORY — DX: Long term (current) use of anticoagulants: Z79.01

## 2016-04-07 LAB — MAGNESIUM: MAGNESIUM: 1.9 mg/dL (ref 1.7–2.4)

## 2016-04-07 LAB — BASIC METABOLIC PANEL
ANION GAP: 7 (ref 5–15)
BUN: 17 mg/dL (ref 6–20)
CHLORIDE: 93 mmol/L — AB (ref 101–111)
CO2: 31 mmol/L (ref 22–32)
CREATININE: 0.87 mg/dL (ref 0.44–1.00)
Calcium: 9.3 mg/dL (ref 8.9–10.3)
GFR calc non Af Amer: 59 mL/min — ABNORMAL LOW (ref >60)
Glucose, Bld: 100 mg/dL — ABNORMAL HIGH (ref 65–99)
POTASSIUM: 4.6 mmol/L (ref 3.5–5.1)
SODIUM: 131 mmol/L — AB (ref 135–145)

## 2016-04-07 LAB — TROPONIN I: Troponin I: <0.03 ng/mL (ref <0.031)

## 2016-04-07 LAB — ECHOCARDIOGRAM COMPLETE
Height: 61 in
Weight: 2640 oz

## 2016-04-07 MED ORDER — ALPRAZOLAM ER 1 MG PO TB24: 1.0000 mg | ORAL_TABLET | ORAL | Status: DC

## 2016-04-07 MED ORDER — PANTOPRAZOLE SODIUM 40 MG PO TBEC
40.0000 mg | DELAYED_RELEASE_TABLET | Freq: Every day | ORAL | Status: DC
  Administered 2016-04-07: 40 mg via ORAL
  Filled 2016-04-07: qty 1

## 2016-04-07 MED ORDER — TRIAMTERENE-HCTZ 37.5-25 MG PO CAPS
1.0000 | ORAL_CAPSULE | ORAL | Status: DC
Start: 2016-04-07 — End: 2016-04-07

## 2016-04-07 MED ORDER — DIMENHYDRINATE 50 MG PO TABS: 50.0000 mg | ORAL_TABLET | Freq: Three times a day (TID) | ORAL | Status: DC | PRN

## 2016-04-07 MED ORDER — GI COCKTAIL ~~LOC~~: 30.0000 mL | Freq: Four times a day (QID) | ORAL | Status: DC | PRN

## 2016-04-07 MED ORDER — IRBESARTAN 300 MG PO TABS: 300.0000 mg | ORAL_TABLET | Freq: Every day | ORAL | Status: DC

## 2016-04-07 MED ORDER — TRAMADOL HCL 50 MG PO TABS: 50.0000 mg | ORAL_TABLET | Freq: Four times a day (QID) | ORAL | Status: DC | PRN

## 2016-04-07 MED ORDER — ACETAMINOPHEN 325 MG PO TABS: 650.0000 mg | ORAL_TABLET | ORAL | Status: DC | PRN

## 2016-04-07 MED ORDER — AMLODIPINE BESYLATE 5 MG PO TABS
10.0000 mg | ORAL_TABLET | Freq: Every day | ORAL | Status: DC
Start: 2016-04-07 — End: 2016-04-07
  Administered 2016-04-07: 10 mg via ORAL
  Filled 2016-04-07: qty 2

## 2016-04-07 MED ORDER — DOCUSATE SODIUM 100 MG PO CAPS: 100.0000 mg | ORAL_CAPSULE | Freq: Two times a day (BID) | ORAL | Status: DC | PRN

## 2016-04-07 MED ORDER — HYDROCODONE-ACETAMINOPHEN 5-325 MG PO TABS: 1.0000 | ORAL_TABLET | Freq: Three times a day (TID) | ORAL | Status: DC | PRN

## 2016-04-07 MED ORDER — ALPRAZOLAM 0.25 MG PO TABS: 1.0000 mg | ORAL_TABLET | Freq: Every day | ORAL | Status: DC

## 2016-04-07 MED ORDER — LORATADINE 10 MG PO TABS
10.0000 mg | ORAL_TABLET | Freq: Every day | ORAL | Status: DC
  Administered 2016-04-07: 10 mg via ORAL
  Filled 2016-04-07: qty 1

## 2016-04-07 MED ORDER — ONDANSETRON HCL 4 MG/2ML IJ SOLN: 4.0000 mg | Freq: Four times a day (QID) | INTRAMUSCULAR | Status: DC | PRN

## 2016-04-07 MED ORDER — APIXABAN 5 MG PO TABS
5.0000 mg | ORAL_TABLET | Freq: Two times a day (BID) | ORAL | Status: DC
  Administered 2016-04-07: 5 mg via ORAL
  Filled 2016-04-07: qty 1

## 2016-04-07 NOTE — Discharge Summary (Signed)
Physician Discharge Summary  Tonya Maddox:[id number] DOB: 05/22/1928 DOA: 04/06/2016  PCP: Devra Dopp, MD  Admit date: 04/06/2016 Discharge date: 04/07/2016  Time spent: 35 minutes  Recommendations for Outpatient Follow-up:  1. Please refer to history of presenting illness date documented earlier this morning 2. Patient will have outpatient cardiology stress Myoview performed as an outpatient per discussion with Dr. [place] 3. Would recommend reflux precautions, discussion regarding risks and benefits of hiatal hernia intervention versus not 4. -May benefit from outpatient neuropathy workup-note that she has an element of chronic pain and may benefit from An allergic medications in addition to her other medications 5. -No acute changes to medications have been performed and patient should continue on anticoagulation and rate controlling medications as per prior 6. Might benefit from Chem-12, CBC 1 week  Discharge Diagnoses:  Principal Problem:   Chest pain Active Problems:   GERD (gastroesophageal reflux disease)   Anxiety   Hyponatremia   S/P placement of cardiac pacemaker   Hypertension   Atrial flutter (HCC)   Chronic anticoagulation   Discharge Condition: Improved  Diet recommendation: Regular--would recommend GERD precautions counseled as an outpatient with PCP  Filed Weights   04/06/16 1926  Weight: 74.844 kg (165 lb)    History of present illness:  82 year old female Tachybradycardia syndrome on Elliquis Status post PPM HTN Bipolar Arthritis admitted transiently this morning 04/07/2016 with nonspecific central chest pain without radiation fleeting in quality No crushing or ischemic type changes or any specific radiation down 1 arm or into jaw   Opponent 3 was negative EKG showed no T-wave inversions but did show rate controlled flutter Echocardiogram was performed showing a preserved EF as per verbal report by Dr. [place] there were no wall motion  abnormalities and only moderate MR and TR  I discussed the patient's slightly elevated heart score with cardiologist Dr. [place] who concurred that in a setting of 3 negative troponins as well as no EKG changes patient may be a good candidate for outpatient stress testing within the next 1-2 weeks -I did discuss with the patient's to coordinate with social work as patient may have difficulty getting home -Patient is stable from my perspective for discharge home   Discharge Exam: Filed Vitals:   04/07/16 0800 04/07/16 0946  BP: 130/75 136/62  Pulse: 68 81  Temp:    Resp: 17 16    General: Alert pleasant slightly orange-appearing skin Cardiovascular: S1-S2 no murmur rub or gallop Respiratory: Clinically clear no added sound  Discharge Instructions   Discharge Instructions    Diet - low sodium heart healthy    Complete by:  As directed      Discharge instructions    Complete by:  As directed   I do not think your symptoms of chest pain were related to any type of heart pain and it is very unclear whether this was more related to reflux or a hernia that you have in her stomach causing these symptoms.     Increase activity slowly    Complete by:  As directed           Current Discharge Medication List    START taking these medications   Details  Alum & Mag Hydroxide-Simeth (GI COCKTAIL) SUSP suspension Take 30 mLs by mouth 4 (four) times daily as needed for indigestion (or chest pain). Shake well. Qty: 30 mL, Refills: 0      CONTINUE these medications which have NOT CHANGED   Details  ALPRAZolam Prudy Feeler  XR) 1 MG 24 hr tablet Take 1 mg by mouth every morning.    amLODipine (NORVASC) 10 MG tablet Take 10 mg by mouth daily.    apixaban (ELIQUIS) 5 MG TABS tablet Take 5 mg by mouth 2 (two) times daily.    dimenhyDRINATE (DRAMAMINE) 50 MG tablet Take 50 mg by mouth every 8 (eight) hours as needed for dizziness.    docusate sodium (COLACE) 100 MG capsule Take 100 mg by mouth 2  (two) times daily as needed for mild constipation.    esomeprazole (NEXIUM) 40 MG capsule Take 40 mg by mouth daily as needed. For heartburn    fexofenadine (ALLEGRA) 60 MG tablet Take 60 mg by mouth daily as needed for allergies.     HYDROcodone-acetaminophen (NORCO/VICODIN) 5-325 MG tablet Take 1 tablet by mouth every 8 (eight) hours as needed for moderate pain.    Multiple Vitamins-Minerals (PRESERVISION/LUTEIN PO) Take 2 tablets by mouth daily. Reported on 04/06/2016    traMADol (ULTRAM) 50 MG tablet Take 50 mg by mouth every 6 (six) hours as needed for moderate pain.    triamterene-hydrochlorothiazide (DYAZIDE) 37.5-25 MG per capsule Take 1 capsule by mouth every morning.    valsartan (DIOVAN) 320 MG tablet Take 1 tablet by mouth daily.   Associated Diagnoses: Essential hypertension       Allergies  Allergen Reactions  . Aleve [Naproxen Sodium]     "Made her feel like she was floating for days"  . Aspirin     "Made stomach burn"  . Terazosin Other (See Comments)    Patient states in causes myalgia      The results of significant diagnostics from this hospitalization (including imaging, microbiology, ancillary and laboratory) are listed below for reference.    Significant Diagnostic Studies: Dg Chest 2 View  04/06/2016  CLINICAL DATA:  Acute onset of generalized chest tightness and shortness of breath. Hand and foot tingling. Initial encounter. EXAM: CHEST  2 VIEW COMPARISON:  Chest radiograph performed 02/24/2013 FINDINGS: The lungs are well-aerated and clear. There is no evidence of focal opacification, pleural effusion or pneumothorax. The heart is mildly enlarged. A pacemaker is noted at the left chest wall, with leads ending at the right atrium and right ventricle. No acute osseous abnormalities are seen. A moderate hiatal hernia is noted. Clips are noted within the right upper quadrant, reflecting prior cholecystectomy. IMPRESSION: 1. Lungs remain grossly clear. 2. Mild  cardiomegaly. 3. Moderate hiatal hernia noted. Electronically Signed   By: Roanna Raider M.D.   On: 04/06/2016 20:12    Microbiology: No results found for this or any previous visit (from the past 240 hour(s)).   Labs: Basic Metabolic Panel:  Recent Labs Lab 04/06/16 1938 04/07/16 0414  NA 129* 131*  K 4.9 4.6  CL 95* 93*  CO2 26 31  GLUCOSE 86 100*  BUN 22* 17  CREATININE 0.93 0.87  CALCIUM 9.2 9.3  MG  --  1.9   Liver Function Tests: No results for input(s): AST, ALT, ALKPHOS, BILITOT, PROT, ALBUMIN in the last 168 hours. No results for input(s): LIPASE, AMYLASE in the last 168 hours. No results for input(s): AMMONIA in the last 168 hours. CBC:  Recent Labs Lab 04/06/16 1938  WBC 6.0  HGB 14.4  HCT 43.1  MCV 93.3  PLT 225   Cardiac Enzymes:  Recent Labs Lab 04/07/16 0206 04/07/16 0414 04/07/16 0812  TROPONINI <0.03 <0.03 <0.03   BNP: BNP (last 3 results) No results for input(s): BNP  in the last 8760 hours.  ProBNP (last 3 results) No results for input(s): PROBNP in the last 8760 hours.  CBG: No results for input(s): GLUCAP in the last 168 hours.     SignedRhetta Mura:  Kain Milosevic, JAI-GURMUKH MD   [facility] 04/07/2016, 10:52 AM

## 2016-04-07 NOTE — ED Notes (Signed)
PT denies chest pain at this time. PT reports her back is sore, but she feels that is due to the stretcher. No requests at this time.

## 2016-04-07 NOTE — ED Notes (Signed)
Social work to come to ED

## 2016-04-07 NOTE — ED Notes (Signed)
PT given discharge papers by Denny PeonErin, RN. PT has already signed out. PT taken to vehicle via wheelchair by RN. PT's brother is here to pick her up.

## 2016-04-07 NOTE — ED Notes (Signed)
PT leaves for Echo at this time. Admitting physician calls and requests a repeat EKG at noon. He is made aware that PT will go to inpatient room after echo. He requests that PT remain in the ED until she is seen by him in the ED. Charge RN made aware

## 2016-04-07 NOTE — Progress Notes (Signed)
CSW engaged with Patient at her bedside. Patient reports that she lives with her brother who has transportation and is able to come and pick her up. Patient gave CSW permission to contact her brother on her behalf. Patient's brother agreed to come and pick patient up and reports that he will be here in about 25 minutes and requests that Patient be in the waiting room of the ED. CSW informed Patient and RN. Patient appreciative of CSW's assistance. CSW signing off. Please contact if new need(s) arise.          Lance MussAshley Gardner,MSW, LCSW [facility] ED/36M Clinical Social Worker [phone]

## 2016-04-07 NOTE — ED Notes (Signed)
EMS 18G IV removed. PT complained of pain at site. Redness and swelling noted at insertion site. 20G IV placed in right AC, no complications.

## 2016-04-07 NOTE — ED Notes (Signed)
PT can be discharged. PT has construction in her front yard and cannot be transported home by EMS. Hospitalist requests that we consult case management or social work.

## 2016-04-07 NOTE — ED Notes (Signed)
PT assisted to dress. PT assisted into wheelchair. We are waiting for discharge paperwork and PT will be taken to waiting room

## 2016-04-07 NOTE — ED Notes (Signed)
Spoke with Lowella BandyNikki, PA-C in regards to plan of care. Patient now agreeing for admission.

## 2016-04-07 NOTE — Progress Notes (Signed)
  Echocardiogram 2D Echocardiogram has been performed.  Tonya Maddox, Tonya Maddox 04/07/2016, 9:18 AM

## 2016-04-07 NOTE — H&P (Signed)
History and Physical    Tonya Maddox ZOX:[id number] DOB: 12-Nov-1928 DOA: 04/06/2016  Referring MD/NP/PA: Montez Morita PA PCP: Devra Dopp, MD  Patient coming from: Home  Chief Complaint: Chest pain  HPI: Tonya Maddox is a 83 y.o. female with medical history significant of bradycardia, atrial flutter/fib on Eliquis, s/p PM, hypertension, depression/anxiety, and arthritis; who presents with complaints of chest pain. Patient notes his symptoms started around 11 AM yesterday morning while straightening up the kitchen. Initially, noted some numbness and tingling of the bilateral hands and feet. She describes pain as a "twinge" of pain on the left side of her chest. Pain noted to be a 3- 5 out of 10 on the pain scale. Symptoms or intermittent and only lasted about 30 seconds or less and would self resolve. Associated symptoms include some complaints of chest tightness and shortness of breath with exertion. Denies trying anything to relieve symptoms.States that she's never had pain like this previously before.  Patient denies any recent changes in weight, fever, chills, urinary frequency, dysuria,  abdominal pain, or diarrhea. Patient's last echocardiogram revealed a EF of 65-70% in 02/2013.  ED Course: Upon admission into the emergency department patient was seen to have vitals relatively within normal limits. Lab work was relatively unremarkable except for sodium of 129 which appears to be chronic and elevated BUN of 22. Initial troponins 2 were negative and EKG showing atrial flutter ventricularly paced. Chest x-ray showing no acute abnormalities. [facility] consulted to admit for chest pain rule out.  Review of Systems: As per HPI otherwise 10 point review of systems negative.   Past Medical History  Diagnosis Date  . Depression   . Hypertension   . Vertigo   . GERD (gastroesophageal reflux disease)   . Arrhythmia   . Anxiety   . Syncope     2005  . Bradycardia     2004  . Arthritis    knees and right hip  . Hard of hearing   . Obesity (BMI 30-39.9)     Past Surgical History  Procedure Laterality Date  . Knee arthroscopy    . Cholecystectomy    . Appendectomy    . Permanent pacemaker insertion N/A 02/19/2013    Procedure: PERMANENT PACEMAKER INSERTION;  Surgeon: Marinus Maw, MD;  Location[facility] CATH LAB;  Service: Cardiovascular;  Laterality: N/A;     reports that she has never smoked. She has never used smokeless tobacco. She reports that she does not drink alcohol or use illicit drugs.  Allergies  Allergen Reactions  . Aleve [Naproxen Sodium]     "Made her feel like she was floating for days"  . Aspirin     "Made stomach burn"  . Terazosin Other (See Comments)    Patient states in causes myalgia    Family History  Problem Relation Age of Onset  . Heart Problems      died at 33    Prior to Admission medications   Medication Sig Start Date End Date Taking? Authorizing Provider  ALPRAZolam (XANAX XR) 1 MG 24 hr tablet Take 1 mg by mouth every morning.   Yes Historical Provider, MD  amLODipine (NORVASC) 10 MG tablet Take 10 mg by mouth daily.   Yes Historical Provider, MD  apixaban (ELIQUIS) 5 MG TABS tablet Take 5 mg by mouth 2 (two) times daily.   Yes Historical Provider, MD  dimenhyDRINATE (DRAMAMINE) 50 MG tablet Take 50 mg by mouth every 8 (eight) hours as needed  for dizziness.   Yes Historical Provider, MD  docusate sodium (COLACE) 100 MG capsule Take 100 mg by mouth 2 (two) times daily as needed for mild constipation.   Yes Historical Provider, MD  esomeprazole (NEXIUM) 40 MG capsule Take 40 mg by mouth daily as needed. For heartburn   Yes Historical Provider, MD  fexofenadine (ALLEGRA) 60 MG tablet Take 60 mg by mouth daily as needed for allergies.    Yes Historical Provider, MD  HYDROcodone-acetaminophen (NORCO/VICODIN) 5-325 MG tablet Take 1 tablet by mouth every 8 (eight) hours as needed for moderate pain.   Yes Historical Provider, MD  Multiple  Vitamins-Minerals (PRESERVISION/LUTEIN PO) Take 2 tablets by mouth daily. Reported on 04/06/2016   Yes Historical Provider, MD  traMADol (ULTRAM) 50 MG tablet Take 50 mg by mouth every 6 (six) hours as needed for moderate pain.   Yes Historical Provider, MD  triamterene-hydrochlorothiazide (DYAZIDE) 37.5-25 MG per capsule Take 1 capsule by mouth every morning.   Yes Historical Provider, MD  valsartan (DIOVAN) 320 MG tablet Take 1 tablet by mouth daily. 06/12/14  Yes Historical Provider, MD    Physical Exam: Filed Vitals:   04/06/16 2230 04/06/16 2300 04/07/16 0045 04/07/16 0100  BP: 145/67 131/84 148/94 129/77  Pulse:  61 51 57  Temp:      TempSrc:      Resp: Height:      Weight:      SpO2: 99% 98% 97% 97%      Constitutional: NAD, calm, comfortable Filed Vitals:   04/06/16 2230 04/06/16 2300 04/07/16 0045 04/07/16 0100  BP: 145/67 131/84 148/94 129/77  Pulse:  61 51 57  Temp:      TempSrc:      Resp: Height:      Weight:      SpO2: 99% 98% 97% 97%   Eyes: PERRL, lids and conjunctivae normal ENMT: Mucous membranes are moist. Posterior pharynx clear of any exudate or lesions.Normal dentition.  Neck: normal, supple, no masses, no thyromegaly Respiratory: clear to auscultation bilaterally, no wheezing, no crackles. Normal respiratory effort. No accessory muscle use.  Cardiovascular: Regular rate with pacemaker present underneath skin on the left hand side of chest. No lower extremity edema appreciated. Abdomen: no tenderness, no masses palpated. No hepatosplenomegaly. Bowel sounds positive.  Musculoskeletal: no clubbing / cyanosis. No joint deformity upper and lower extremities. Good ROM, no contractures. Normal muscle tone.  Skin: no rashes, lesions, ulcers. No induration Neurologic: CN 2-12 grossly intact. Sensation intact, DTR normal. Strength 5/5 in all 4.  Psychiatric: Normal judgment and insight. Alert and oriented x 3. Normal mood.     Labs on  Admission: I have personally reviewed following labs and imaging studies  CBC:  Recent Labs Lab 04/06/16 1938  WBC 6.0  HGB 14.4  HCT 43.1  MCV 93.3  PLT 225   Basic Metabolic Panel:  Recent Labs Lab 04/06/16 1938  NA 129*  K 4.9  CL 95*  CO2 26  GLUCOSE 86  BUN 22*  CREATININE 0.93  CALCIUM 9.2   GFR: Estimated Creatinine Clearance: 40.9 mL/min (by C-G formula based on Cr of 0.93). Liver Function Tests: No results for input(s): AST, ALT, ALKPHOS, BILITOT, PROT, ALBUMIN in the last 168 hours. No results for input(s): LIPASE, AMYLASE in the last 168 hours. No results for input(s): AMMONIA in the last 168 hours. Coagulation Profile: No results for input(s): INR, PROTIME in the last 168  hours. Cardiac Enzymes: No results for input(s): CKTOTAL, CKMB, CKMBINDEX, TROPONINI in the last 168 hours. BNP (last 3 results) No results for input(s): PROBNP in the last 8760 hours. HbA1C: No results for input(s): HGBA1C in the last 72 hours. CBG: No results for input(s): GLUCAP in the last 168 hours. Lipid Profile: No results for input(s): CHOL, HDL, LDLCALC, TRIG, CHOLHDL, LDLDIRECT in the last 72 hours. Thyroid Function Tests: No results for input(s): TSH, T4TOTAL, FREET4, T3FREE, THYROIDAB in the last 72 hours. Anemia Panel: No results for input(s): VITAMINB12, FOLATE, FERRITIN, TIBC, IRON, RETICCTPCT in the last 72 hours. Urine analysis:    Component Value Date/Time   COLORURINE YELLOW 02/18/2013 2352   APPEARANCEUR CLEAR 02/18/2013 2352   LABSPEC 1.009 02/18/2013 2352   PHURINE 6.0 02/18/2013 2352   GLUCOSEU NEGATIVE 02/18/2013 2352   HGBUR NEGATIVE 02/18/2013 2352   BILIRUBINUR NEGATIVE 02/18/2013 2352   KETONESUR NEGATIVE 02/18/2013 2352   PROTEINUR NEGATIVE 02/18/2013 2352   UROBILINOGEN 0.2 02/18/2013 2352   NITRITE NEGATIVE 02/18/2013 2352   LEUKOCYTESUR NEGATIVE 02/18/2013 2352   Sepsis Labs: No results found for this or any previous visit (from the past  240 hour(s)).   Radiological Exams on Admission: Dg Chest 2 View  04/06/2016  CLINICAL DATA:  Acute onset of generalized chest tightness and shortness of breath. Hand and foot tingling. Initial encounter. EXAM: CHEST  2 VIEW COMPARISON:  Chest radiograph performed 02/24/2013 FINDINGS: The lungs are well-aerated and clear. There is no evidence of focal opacification, pleural effusion or pneumothorax. The heart is mildly enlarged. A pacemaker is noted at the left chest wall, with leads ending at the right atrium and right ventricle. No acute osseous abnormalities are seen. A moderate hiatal hernia is noted. Clips are noted within the right upper quadrant, reflecting prior cholecystectomy. IMPRESSION: 1. Lungs remain grossly clear. 2. Mild cardiomegaly. 3. Moderate hiatal hernia noted. Electronically Signed   By: Roanna RaiderJeffery  Chang M.D.   On: 04/06/2016 20:12    EKG: Independently reviewed. Atrial flutter/atrial flutter ventricularly paced with a rate of 69 bpm  Assessment/Plan Chest pain: Acute. Patient reports having intermittent "twinges" left-sided chest pain. Heart score= 4. Patient sees Dr. Ladona Ridgelaylor of cardiology. - admit to Telemetry  - troponins x 3 q 3 hours  - Repeat EKG in a.m. - Echocardiogram  -  May warrant cardiology consultation in a.m. patient sees Dr. Ladona Ridgelaylor  Atrial flutters/atrial fibrillation s/p Pacemaker on chronic anticoagulation therapy: Chronic. chadvasc score 4. Patient appears rate controlled. - Continue Eliquis  Essential hypertension - Continue Norvasc, Dyazide, and valsartan  Chronic pain  - Continue tramadol/hydrocodone prn mild to mod pain   Anxiety/depression - continue Xanax    Hyponatremia: Chronic. Sodium 129 on admission but review of records shows that sodium levels have been chronically low since 2014. It likely be related to patient Dyazide - Continue to monitor  GERD - Pharmacy substitution of Protonix for Neixum  DVT prophylaxis: Lovenox Code  Status: Full Family Communication: None Disposition Plan:  Possible discharge home if all studies negative    Consults called: none Admission status: Observation telemetry  Clydie Braunondell A Rochelle Larue MD [facility] Pager [phone]  If 7PM-7AM, please contact night-coverage [email or web address] Password [email or web address]  04/07/2016, 1:09 AM

## 2016-04-07 NOTE — ED Notes (Signed)
PT assisted to bedside. Call bell in reach. PT requests privacy. PT agrees to use call bell if any assistance is needed. Hygenic necessities in reach

## 2016-11-12 ENCOUNTER — Emergency Department (HOSPITAL_COMMUNITY)
Admission: EM | Admit: 2016-11-12 | Discharge: 2016-11-12 | Disposition: A | Discharge status: Home / Self Care | Payer: Medicare Other | Attending: Emergency Medicine | Admitting: Emergency Medicine

## 2016-11-12 ENCOUNTER — Emergency Department (HOSPITAL_COMMUNITY): Payer: Medicare Other

## 2016-11-12 ENCOUNTER — Encounter (HOSPITAL_COMMUNITY): Payer: Self-pay | Admitting: Family Medicine

## 2016-11-12 DIAGNOSIS — I10 Essential (primary) hypertension: Secondary | ICD-10-CM | POA: Insufficient documentation

## 2016-11-12 DIAGNOSIS — M545 Low back pain, unspecified: Secondary | ICD-10-CM

## 2016-11-12 DIAGNOSIS — Z7901 Long term (current) use of anticoagulants: Secondary | ICD-10-CM | POA: Insufficient documentation

## 2016-11-12 DIAGNOSIS — W109XXA Fall (on) (from) unspecified stairs and steps, initial encounter: Secondary | ICD-10-CM | POA: Insufficient documentation

## 2016-11-12 DIAGNOSIS — Z950 Presence of cardiac pacemaker: Secondary | ICD-10-CM | POA: Insufficient documentation

## 2016-11-12 DIAGNOSIS — Y9301 Activity, walking, marching and hiking: Secondary | ICD-10-CM | POA: Insufficient documentation

## 2016-11-12 DIAGNOSIS — Y929 Unspecified place or not applicable: Secondary | ICD-10-CM | POA: Insufficient documentation

## 2016-11-12 DIAGNOSIS — Y999 Unspecified external cause status: Secondary | ICD-10-CM | POA: Insufficient documentation

## 2016-11-12 DIAGNOSIS — W19XXXA Unspecified fall, initial encounter: Secondary | ICD-10-CM

## 2016-11-12 NOTE — ED Notes (Signed)
Pt ambulated from room to bathroom with assist of a walker

## 2016-11-12 NOTE — ED Provider Notes (Signed)
[facility]-EMERGENCY DEPT Provider Note   CSN: [id number] Arrival date & time: 11/12/16  1502     History   Chief Complaint Chief Complaint  Patient presents with  . Fall    HPI K[place] Sanon Kalisz is a 82 y.o. female.  HPI   82 year old female presents status post fall. Patient reports she was walking down her stairs which she normally uses a mechanical left. She notes she was at the bottom of the stairs when her feet slipped out from underneath her causing her to fall back landing on her butt and back. She does not recall a loss of consciousness, reports she did not hit her head or neck. Patient reports a history of chronic pain due to arthritis in her joints. She notes that symptoms are severely worsened status post fall, reports pain to her lower lumbar and posterior hip, she feels the hip pain is exacerbation of her arthritis, but is concerned about the lower back pain. Patient denies any loss of distal sensation strength or motor function. Patient reports dull abdominal ache, she notes this has been waxing and waning with ongoing constipation due to narcotics. She has been taking over-the-counter medications for improvement. She denies any focal abdominal tenderness, fever, vomiting, change in stool caliber. She reports hard firm stools, still passing gas.    Past Medical History:  Diagnosis Date  . Anxiety   . Arrhythmia   . Arthritis    knees and right hip  . Bradycardia    2004  . Depression   . GERD (gastroesophageal reflux disease)   . Hard of hearing   . Hypertension   . Obesity (BMI 30-39.9)   . Syncope    2005  . Vertigo     Patient Active Problem List   Diagnosis Date Noted  . Chest pain 04/07/2016  . Atrial flutter (HCC) 04/07/2016  . Chronic anticoagulation 04/07/2016  . Hypertension 03/01/2013  . Hypercalcemia 03/01/2013  . S/P placement of cardiac pacemaker 02/20/2013  . Bradycardia 02/18/2013  . Hyponatremia 02/18/2013  . Chest tightness or pressure  02/18/2013  . Heart block AV third degree (HCC) 02/18/2013  . Depression   . Vertigo   . GERD (gastroesophageal reflux disease)   . Arrhythmia   . Anxiety     Past Surgical History:  Procedure Laterality Date  . APPENDECTOMY    . CHOLECYSTECTOMY    . KNEE ARTHROSCOPY    . PERMANENT PACEMAKER INSERTION N/A 02/19/2013   Procedure: PERMANENT PACEMAKER INSERTION;  Surgeon: Marinus Maw, MD;  Location: [facility] CATH LAB;  Service: Cardiovascular;  Laterality: N/A;    OB History    No data available       Home Medications    Prior to Admission medications   Medication Sig Start Date End Date Taking? Authorizing Provider  ALPRAZolam (XANAX XR) 1 MG 24 hr tablet Take 1 mg by mouth daily.    Yes Historical Provider, MD  amLODipine (NORVASC) 10 MG tablet Take 10 mg by mouth daily.   Yes Historical Provider, MD  apixaban (ELIQUIS) 5 MG TABS tablet Take 5 mg by mouth 2 (two) times daily.   Yes Historical Provider, MD  esomeprazole (NEXIUM) 40 MG capsule Take 40 mg by mouth daily as needed (for acid reflux).    Yes Historical Provider, MD  fexofenadine (ALLEGRA) 180 MG tablet Take 180 mg by mouth daily as needed for allergies.   Yes Historical Provider, MD  fluticasone (FLONASE) 50 MCG/ACT nasal spray Place 1-2 sprays into  both nostrils daily as needed for rhinitis.   Yes Historical Provider, MD  HYDROcodone-acetaminophen (NORCO/VICODIN) 5-325 MG tablet Take 1 tablet by mouth every 8 (eight) hours as needed for moderate pain.   Yes Historical Provider, MD  Multiple Vitamins-Minerals (PRESERVISION AREDS 2) CAPS Take 1 capsule by mouth 2 (two) times daily.   Yes Historical Provider, MD  senna (SENOKOT) 8.6 MG tablet Take 2 tablets by mouth 2 (two) times daily as needed for constipation.   Yes Historical Provider, MD  traMADol (ULTRAM) 50 MG tablet Take 50 mg by mouth every 6 (six) hours as needed for moderate pain.   Yes Historical Provider, MD  triamterene-hydrochlorothiazide (MAXZIDE-25) 37.5-25  MG tablet Take 1 tablet by mouth daily.   Yes Historical Provider, MD  valsartan (DIOVAN) 320 MG tablet Take 320 mg by mouth daily.    Yes Historical Provider, MD    Family History Family History  Problem Relation Age of Onset  . Heart Problems      died at 89    Social History Social History  Substance Use Topics  . Smoking status: Never Smoker  . Smokeless tobacco: Never Used  . Alcohol use No     Allergies   Aleve [naproxen sodium]; Aspirin; and Terazosin   Review of Systems Review of Systems  All other systems reviewed and are negative.   Physical Exam Updated Vital Signs BP 101/87   Pulse 62   Temp 97.8 F (36.6 C) (Oral)   Resp 12   Ht 5\' 2"  (1.575 m)   Wt 74.8 kg   SpO2 97%   BMI 30.18 kg/m   Physical Exam  Constitutional: She is oriented to person, place, and time. She appears well-developed and well-nourished.  HENT:  Head: Normocephalic and atraumatic.  Eyes: Conjunctivae are normal. Pupils are equal, round, and reactive to light. Right eye exhibits no discharge. Left eye exhibits no discharge. No scleral icterus.  Neck: Normal range of motion. No JVD present. No tracheal deviation present.  Pulmonary/Chest: Effort normal. No stridor.  Musculoskeletal:  Head is atraumatic, full active range of motion of the neck which is nontender. Patient has no thoracic spinal tenderness, she has tenderness to palpation of the lumbar spine and bilateral posterior hips. She has no instability of the hips, right minor lower back pain with range of motion of bilateral lower extremities. Distal sensation strength and motor function is intact.  Neurological: She is alert and oriented to person, place, and time. Coordination normal.  Psychiatric: She has a normal mood and affect. Her behavior is normal. Judgment and thought content normal.  Nursing note and vitals reviewed.   ED Treatments / Results  Labs (all labs ordered are listed, but only abnormal results are  displayed) Labs Reviewed - No data to display  EKG  EKG Interpretation  Date/Time:  Friday November 12 2016 15:25:13 EST Ventricular Rate:  66 PR Interval:    QRS Duration: 164 QT Interval:  424 QTC Calculation: 445 R Axis:   -86 Text Interpretation:  Afib/flutter and ventricular-paced rhythm No further analysis attempted due to paced rhythm Baseline wander in lead(s) III No significant change since last tracing Confirmed by Ethelda ChickJACUBOWITZ  MD, SAM [phone]) on 11/12/2016 3:29:49 PM       Radiology Dg Lumbar Spine Complete  Result Date: 11/12/2016 CLINICAL DATA:  Lower back pain after fall today. EXAM: LUMBAR SPINE - COMPLETE 4+ VIEW COMPARISON:  CT scan of January 24, 2008. FINDINGS: No definite fracture is noted. Mild degenerative  disc disease is noted at L1-2 and L2-3 with anterior osteophyte formation. Severe degenerative disc disease is noted at L3-4 with grade 1 retrolisthesis at this level. Diffuse osteopenia is noted. Atherosclerosis of abdominal aorta is noted. IMPRESSION: Multilevel degenerative disc disease. Aortic atherosclerosis. No acute abnormality seen in the lumbar spine. Electronically Signed   By: Lupita RaiderJames  Green Jr, M.D.   On: 11/12/2016 17:28   Dg Hips Bilat W Or Wo Pelvis 3-4 Views  Result Date: 11/12/2016 CLINICAL DATA:  Bilateral hip and lower back pain after fall today. EXAM: DG HIP (WITH OR WITHOUT PELVIS) 3-4V BILAT COMPARISON:  None. FINDINGS: There is no evidence of hip fracture or dislocation. There is no evidence of arthropathy or other focal bone abnormality. IMPRESSION: Normal bilateral hips. Electronically Signed   By: Lupita RaiderJames  Green Jr, M.D.   On: 11/12/2016 17:25    Procedures Procedures (including critical care time)  Medications Ordered in ED Medications - No data to display   Initial Impression / Assessment and Plan / ED Course  I have reviewed the triage vital signs and the nursing notes.  Pertinent labs & imaging results that were available during  my care of the patient were reviewed by me and considered in my medical decision making (see chart for details).  Clinical Course      Final Clinical Impressions(s) / ED Diagnoses   Final diagnoses:  Fall, initial encounter  Bilateral low back pain without sciatica, unspecified chronicity    Labs:  Imaging: DG hips bilateral, DG lumbar spine  Consults:  Therapeutics:  Discharge Meds:   Assessment/Plan:   82 year old female presents status post fall. She with no loss of consciousness, originally with lumbar and posterior hip pain. Plain films showed no acute abnormalities. Patient was ambulated in the hall without difficulty with very minimal pain. I discussed the chance of an occult fracture and options of CT scan. Patient does not feel that she has any broken bones, is ambulating at her baseline with just slightly worsened pain over her baseline. She has no neurological deficits, no other signs of trauma from the fall. Both her and her husband agreed that discharge home would be appropriate with close follow-up if symptoms persist, return if symptoms worsen. No further questions or concerns at time of discharge.    New Prescriptions New Prescriptions   No medications on file     Eyvonne MechanicJeffrey Maydelin Deming, PA-C 11/12/16 1923    Lyndal Pulleyaniel Knott, MD 11/13/16 [phone]

## 2016-11-12 NOTE — ED Notes (Signed)
Pt requesting pain medication and will inform provider.

## 2016-11-12 NOTE — ED Triage Notes (Signed)
[facility] Patient fell today at 9:30, while walking down the last stairs. EMS reports patient missed the last step. Possible LOC but pt is unsure. Pt took a Oxycodone 5/325mg  at home before calling EMS.

## 2016-11-12 NOTE — Discharge Instructions (Signed)
Please read attached information. If you experience any new or worsening signs or symptoms please return to the emergency room for evaluation. Please follow-up with your primary care provider or specialist as discussed.  °

## 2016-11-27 ENCOUNTER — Emergency Department (HOSPITAL_COMMUNITY): Payer: Medicare Other

## 2016-11-27 ENCOUNTER — Encounter (HOSPITAL_COMMUNITY): Payer: Self-pay | Admitting: Emergency Medicine

## 2016-11-27 ENCOUNTER — Emergency Department (HOSPITAL_COMMUNITY)
Admission: EM | Admit: 2016-11-27 | Discharge: 2016-11-27 | Disposition: A | Discharge status: Home / Self Care | Payer: Medicare Other | Attending: Emergency Medicine | Admitting: Emergency Medicine

## 2016-11-27 DIAGNOSIS — Y92099 Unspecified place in other non-institutional residence as the place of occurrence of the external cause: Secondary | ICD-10-CM | POA: Diagnosis not present

## 2016-11-27 DIAGNOSIS — M545 Low back pain, unspecified: Secondary | ICD-10-CM

## 2016-11-27 DIAGNOSIS — Y999 Unspecified external cause status: Secondary | ICD-10-CM | POA: Diagnosis not present

## 2016-11-27 DIAGNOSIS — Z950 Presence of cardiac pacemaker: Secondary | ICD-10-CM | POA: Insufficient documentation

## 2016-11-27 DIAGNOSIS — Y9301 Activity, walking, marching and hiking: Secondary | ICD-10-CM | POA: Diagnosis not present

## 2016-11-27 DIAGNOSIS — I10 Essential (primary) hypertension: Secondary | ICD-10-CM | POA: Insufficient documentation

## 2016-11-27 DIAGNOSIS — W010XXA Fall on same level from slipping, tripping and stumbling without subsequent striking against object, initial encounter: Secondary | ICD-10-CM | POA: Diagnosis not present

## 2016-11-27 DIAGNOSIS — Z7901 Long term (current) use of anticoagulants: Secondary | ICD-10-CM | POA: Diagnosis not present

## 2016-11-27 DIAGNOSIS — Z79899 Other long term (current) drug therapy: Secondary | ICD-10-CM | POA: Insufficient documentation

## 2016-11-27 DIAGNOSIS — R1084 Generalized abdominal pain: Secondary | ICD-10-CM | POA: Insufficient documentation

## 2016-11-27 MED ORDER — METHOCARBAMOL 500 MG PO TABS: 500.0000 mg | ORAL_TABLET | Freq: Three times a day (TID) | ORAL | 0 refills | Status: DC | PRN

## 2016-11-27 MED ORDER — METHOCARBAMOL 500 MG PO TABS
1000.0000 mg | ORAL_TABLET | Freq: Once | ORAL | Status: AC
  Administered 2016-11-27: 1000 mg via ORAL
  Filled 2016-11-27: qty 2

## 2016-11-27 NOTE — ED Notes (Signed)
Bed: [id number] Expected date:  Expected time:  Means of arrival:  Comments: 83 yo F  Multiple complaints

## 2016-11-27 NOTE — ED Notes (Signed)
[facility] called and en route to transport pt home. 

## 2016-11-27 NOTE — ED Provider Notes (Signed)
[facility]-EMERGENCY DEPT Provider Note   CSN: [id number] Arrival date & time: 11/27/16  [id number]     History   Chief Complaint Chief Complaint  Patient presents with  . Back Pain    HPI Tonya Maddox is a 83 y.o. female. She presents for evaluation of back pain, and ongoing concerns about bowel habits, and arthritis pain.  Patient was evaluated here weeks ago after an injury to her back. She was walking down her stairs. She slipped. She did not land on her back or buttock or head impact but twisted. She states she was able to stay upright by holding onto her rails. She states that she has a stair lift chair that she is "supposed to use". States that sometimes she will walk the stairs to "keep up my strength".  X-rays at that time were negative. She states she continues to have some pain in her back. She states she was told that she should contact her primary care physician about a muscle relaxant. Her primary care doctor would not prescribe without seeing her she takes hydrocodone daily for arthritis pain. She frequently gets constipated. She started having more pain in her back after her fall and started taking her hydrocodone every 4 hours and sensitive every 8 became constipated. She "a primary care physician instructed her to take Ex-Lax. She taking Ex-Lax, Senokot, and MiraLAX. States she's had several days of no formed stools and just" just liquid. Not having frequent diarrhea only about once per day. Has some cramping abdominal pain and presents here again.     HPI  Past Medical History:  Diagnosis Date  . Anxiety   . Arrhythmia   . Arthritis    knees and right hip  . Bradycardia    2004  . Depression   . GERD (gastroesophageal reflux disease)   . Hard of hearing   . Hypertension   . Obesity (BMI 30-39.9)   . Syncope    2005  . Vertigo     Patient Active Problem List   Diagnosis Date Noted  . Chest pain 04/07/2016  . Atrial flutter (HCC) 04/07/2016  . Chronic  anticoagulation 04/07/2016  . Hypertension 03/01/2013  . Hypercalcemia 03/01/2013  . S/P placement of cardiac pacemaker 02/20/2013  . Bradycardia 02/18/2013  . Hyponatremia 02/18/2013  . Chest tightness or pressure 02/18/2013  . Heart block AV third degree (HCC) 02/18/2013  . Depression   . Vertigo   . GERD (gastroesophageal reflux disease)   . Arrhythmia   . Anxiety     Past Surgical History:  Procedure Laterality Date  . APPENDECTOMY    . CHOLECYSTECTOMY    . KNEE ARTHROSCOPY    . PERMANENT PACEMAKER INSERTION N/A 02/19/2013   Procedure: PERMANENT PACEMAKER INSERTION;  Surgeon: Marinus MawGregg W Taylor, MD;  [facility];  Service: Cardiovascular;  Laterality: N/A;    OB History    No data available       Home Medications    Prior to Admission medications   Medication Sig Start Date End Date Taking? Authorizing Provider  ALPRAZolam (XANAX XR) 1 MG 24 hr tablet Take 1 mg by mouth daily.    Yes Historical Provider, MD  amLODipine (NORVASC) 10 MG tablet Take 10 mg by mouth daily.   Yes Historical Provider, MD  apixaban (ELIQUIS) 5 MG TABS tablet Take 5 mg by mouth 2 (two) times daily.   Yes Historical Provider, MD  cyanocobalamin 500 MCG tablet Take 500 mcg by mouth daily.  Yes Historical Provider, MD  esomeprazole (NEXIUM) 40 MG capsule Take 40 mg by mouth daily as needed (for acid reflux).    Yes Historical Provider, MD  HYDROcodone-acetaminophen (NORCO/VICODIN) 5-325 MG tablet Take 1 tablet by mouth every 8 (eight) hours as needed for moderate pain.   Yes Historical Provider, MD  Multiple Vitamins-Minerals (PRESERVISION AREDS 2) CAPS Take 1 capsule by mouth 2 (two) times daily.   Yes Historical Provider, MD  Polyethyl Glycol-Propyl Glycol (SYSTANE OP) Apply 1 drop to eye daily as needed (dry eyes).    Yes Historical Provider, MD  ranitidine (ZANTAC) 150 MG tablet Take 150 mg by mouth 2 (two) times daily.   Yes Historical Provider, MD  triamterene-hydrochlorothiazide  (MAXZIDE-25) 37.5-25 MG tablet Take 1 tablet by mouth daily.   Yes Historical Provider, MD  valsartan (DIOVAN) 320 MG tablet Take 320 mg by mouth daily.    Yes Historical Provider, MD  fexofenadine (ALLEGRA) 180 MG tablet Take 180 mg by mouth daily as needed for allergies.    Historical Provider, MD  fluticasone (FLONASE) 50 MCG/ACT nasal spray Place 1-2 sprays into both nostrils daily as needed for rhinitis.    Historical Provider, MD  methocarbamol (ROBAXIN) 500 MG tablet Take 1 tablet (500 mg total) by mouth 3 (three) times daily between meals as needed for muscle spasms (back pain). 11/27/16   Rolland Porter, MD    Family History Family History  Problem Relation Age of Onset  . Heart Problems      died at 68    Social History Social History  Substance Use Topics  . Smoking status: Never Smoker  . Smokeless tobacco: Never Used  . Alcohol use No     Allergies   Aleve [naproxen sodium]; Aspirin; and Terazosin   Review of Systems Review of Systems  Constitutional: Negative for appetite change, chills, diaphoresis, fatigue and fever.  HENT: Negative for mouth sores, sore throat and trouble swallowing.   Eyes: Negative for visual disturbance.  Respiratory: Negative for cough, chest tightness, shortness of breath and wheezing.   Cardiovascular: Negative for chest pain.  Gastrointestinal: Positive for abdominal pain and diarrhea. Negative for abdominal distention, nausea and vomiting.  Endocrine: Negative for polydipsia, polyphagia and polyuria.  Genitourinary: Negative for dysuria, frequency and hematuria.  Musculoskeletal: Positive for arthralgias and back pain. Negative for gait problem.  Skin: Negative for color change, pallor and rash.  Neurological: Negative for dizziness, syncope, light-headedness and headaches.  Hematological: Does not bruise/bleed easily.  Psychiatric/Behavioral: Negative for behavioral problems and confusion.     Physical Exam Updated Vital Signs BP  118/64 (BP Location: Left Arm)   Pulse 60   Temp 97.6 F (36.4 C) (Oral)   Resp 18   SpO2 96%   Physical Exam  Constitutional: She is oriented to person, place, and time. She appears well-developed and well-nourished. No distress.  HENT:  Head: Normocephalic.  Eyes: Conjunctivae are normal. Pupils are equal, round, and reactive to light. No scleral icterus.  Neck: Normal range of motion. Neck supple. No thyromegaly present.  Cardiovascular: Normal rate and regular rhythm.  Exam reveals no gallop and no friction rub.   No murmur heard. Pulmonary/Chest: Effort normal and breath sounds normal. No respiratory distress. She has no wheezes. She has no rales.  Abdominal: Soft. Bowel sounds are normal. She exhibits no distension. There is no tenderness. There is no rebound.  Soft benign abdomen. Normal active bowel sounds. No focal tenderness. Not distended. No impaction on rectal exam.  Musculoskeletal: Normal range of motion.  Tenderness to the right of midline in the low back. Pain radiates to the anterior aspect of her right hip. No radiation to her leg. Normal neurovascular exam reduction of these.  Neurological: She is alert and oriented to person, place, and time.  Skin: Skin is warm and dry. No rash noted.  Psychiatric: She has a normal mood and affect. Her behavior is normal.     ED Treatments / Results  Labs (all labs ordered are listed, but only abnormal results are displayed) Labs Reviewed - No data to display  EKG  EKG Interpretation None       Radiology Dg Abd 1 View  Result Date: 11/27/2016 CLINICAL DATA:  Worsening right pelvic pain. EXAM: ABDOMEN - 1 VIEW COMPARISON:  November 12, 2016 lumbar spine series FINDINGS: There is a large calcified fibroid in the right side of the pelvis. The bowel gas pattern is nonobstructive. Visualized bones and soft tissues are otherwise normal. IMPRESSION: No acute abnormalities identified. Electronically Signed   By: Gerome Sam  III M.D   On: 11/27/2016 08:30    Procedures Procedures (including critical care time)  Medications Ordered in ED Medications  methocarbamol (ROBAXIN) tablet 1,000 mg (1,000 mg Oral Given 11/27/16 0759)     Initial Impression / Assessment and Plan / ED Course  I have reviewed the triage vital signs and the nursing notes.  Pertinent labs & imaging results that were available during my care of the patient were reviewed by me and considered in my medical decision making (see chart for details).  Clinical Course     KUB does not show any formed stool. I think her symptoms are likely from the irritant effect of the Ex-Lax. Told her that she should not require laxatives at this time. I asked her to not take Ex-Lax. Mr. limit her hydrocodone intake. She cannot take anti-inflammatories because she is on a liquids. Rest use Tylenol for arthritis. Liver dry to code" only once per day and on an as-needed basis. She tolerated Robaxin here without any untoward effects. We'll give her short course of Robaxin for her back injury.  Final Clinical Impressions(s) / ED Diagnoses   Final diagnoses:  Acute right-sided low back pain without sciatica  Generalized abdominal pain    New Prescriptions New Prescriptions   METHOCARBAMOL (ROBAXIN) 500 MG TABLET    Take 1 tablet (500 mg total) by mouth 3 (three) times daily between meals as needed for muscle spasms (back pain).     Rolland Porter, MD 11/27/16 [phone]

## 2016-11-27 NOTE — ED Notes (Signed)
At time of discharge/transfer via [facility] to home pt verbalizes pain 10/10 stating "he [provider] told me my pain would like a few days before it got better."

## 2016-11-27 NOTE — ED Triage Notes (Signed)
Brought in by EMS from home with c/o "worsening right pelvic pain".  Pt reported that she was seen here 2 weeks ago for same symptom and was advised to come back here for worsening pain.  Pt reports that pain to the affected area has been progressively worse.  Pt also c/o "constipation" and "dizziness".

## 2016-11-27 NOTE — Discharge Instructions (Signed)
You do not need to take laxative today. Your x-rays do not show constipation.  *Avoid Ex-Lax. It can irritate your colon and cause pain.  *You cannot take anti-inflammatories because of your Eliquis (blood thinner)  *Take Tylenol as needed every 4-6 hours for your arthritis pain. You can take the hydrocodone once per day as needed.  *Avoid taking hydrocodone on a regular schedule.  +Prescription for Robaxin for your muscle strain in your back  *For new primary care opportunity's you can contact:         [facility]:[phone]          -[facility]:[phone]           [facility]:[phone]

## 2016-12-02 ENCOUNTER — Encounter (HOSPITAL_COMMUNITY): Payer: Self-pay | Admitting: Emergency Medicine

## 2016-12-02 ENCOUNTER — Emergency Department (HOSPITAL_COMMUNITY)
Admission: EM | Admit: 2016-12-02 | Discharge: 2016-12-02 | Disposition: A | Discharge status: Home / Self Care | Payer: Medicare Other | Attending: Emergency Medicine | Admitting: Emergency Medicine

## 2016-12-02 ENCOUNTER — Emergency Department (HOSPITAL_COMMUNITY): Payer: Medicare Other

## 2016-12-02 DIAGNOSIS — R197 Diarrhea, unspecified: Secondary | ICD-10-CM

## 2016-12-02 DIAGNOSIS — Z950 Presence of cardiac pacemaker: Secondary | ICD-10-CM | POA: Diagnosis not present

## 2016-12-02 DIAGNOSIS — Z7901 Long term (current) use of anticoagulants: Secondary | ICD-10-CM | POA: Insufficient documentation

## 2016-12-02 DIAGNOSIS — I10 Essential (primary) hypertension: Secondary | ICD-10-CM | POA: Insufficient documentation

## 2016-12-02 DIAGNOSIS — F0390 Unspecified dementia without behavioral disturbance: Secondary | ICD-10-CM

## 2016-12-02 DIAGNOSIS — E876 Hypokalemia: Secondary | ICD-10-CM | POA: Insufficient documentation

## 2016-12-02 DIAGNOSIS — R1084 Generalized abdominal pain: Secondary | ICD-10-CM | POA: Diagnosis present

## 2016-12-02 DIAGNOSIS — Z79899 Other long term (current) drug therapy: Secondary | ICD-10-CM | POA: Insufficient documentation

## 2016-12-02 HISTORY — DX: Unspecified dementia, unspecified severity, without behavioral disturbance, psychotic disturbance, mood disturbance, and anxiety: F03.90

## 2016-12-02 HISTORY — DX: Unspecified dementia without behavioral disturbance: F03.90

## 2016-12-02 LAB — I-STAT CHEM 8, ED
BUN: 13 mg/dL (ref 6–20)
CREATININE: 0.5 mg/dL (ref 0.44–1.00)
Calcium, Ion: 0.93 mmol/L — ABNORMAL LOW (ref 1.15–1.40)
Chloride: 90 mmol/L — ABNORMAL LOW (ref 101–111)
GLUCOSE: 78 mg/dL (ref 65–99)
HCT: 38 % (ref 36.0–46.0)
HEMOGLOBIN: 12.9 g/dL (ref 12.0–15.0)
Potassium: 3 mmol/L — ABNORMAL LOW (ref 3.5–5.1)
Sodium: 126 mmol/L — ABNORMAL LOW (ref 135–145)
TCO2: 23 mmol/L (ref 0–100)

## 2016-12-02 LAB — CBC WITH DIFFERENTIAL/PLATELET
Basophils Absolute: 0 10*3/uL (ref 0.0–0.1)
Basophils Relative: 0 %
EOS ABS: 0 10*3/uL (ref 0.0–0.7)
EOS PCT: 1 %
HCT: 40.6 % (ref 36.0–46.0)
HEMOGLOBIN: 14.8 g/dL (ref 12.0–15.0)
LYMPHS ABS: 1.2 10*3/uL (ref 0.7–4.0)
LYMPHS PCT: 16 %
MCH: 32.5 pg (ref 26.0–34.0)
MCHC: 36.5 g/dL — AB (ref 30.0–36.0)
MCV: 89.2 fL (ref 78.0–100.0)
MONOS PCT: 10 %
Monocytes Absolute: 0.8 10*3/uL (ref 0.1–1.0)
NEUTROS PCT: 73 %
Neutro Abs: 5.9 10*3/uL (ref 1.7–7.7)
Platelets: 259 10*3/uL (ref 150–400)
RBC: 4.55 MIL/uL (ref 3.87–5.11)
RDW: 12.5 % (ref 11.5–15.5)
WBC: 7.9 10*3/uL (ref 4.0–10.5)

## 2016-12-02 LAB — COMPREHENSIVE METABOLIC PANEL
ALK PHOS: 92 U/L (ref 38–126)
ALT: 14 U/L (ref 14–54)
ANION GAP: 10 (ref 5–15)
AST: 20 U/L (ref 15–41)
Albumin: 4.2 g/dL (ref 3.5–5.0)
BUN: 14 mg/dL (ref 6–20)
CALCIUM: 8.7 mg/dL — AB (ref 8.9–10.3)
CO2: 25 mmol/L (ref 22–32)
CREATININE: 0.69 mg/dL (ref 0.44–1.00)
Chloride: 86 mmol/L — ABNORMAL LOW (ref 101–111)
GFR calc Af Amer: >60 mL/min (ref >60)
GFR calc non Af Amer: >60 mL/min (ref >60)
Glucose, Bld: 107 mg/dL — ABNORMAL HIGH (ref 65–99)
Potassium: 3.1 mmol/L — ABNORMAL LOW (ref 3.5–5.1)
SODIUM: 121 mmol/L — AB (ref 135–145)
TOTAL PROTEIN: 6.2 g/dL — AB (ref 6.5–8.1)
Total Bilirubin: 1.1 mg/dL (ref 0.3–1.2)

## 2016-12-02 LAB — URINALYSIS, ROUTINE W REFLEX MICROSCOPIC
BILIRUBIN URINE: NEGATIVE
Glucose, UA: NEGATIVE mg/dL
HGB URINE DIPSTICK: NEGATIVE
KETONES UR: 20 mg/dL — AB
Leukocytes, UA: NEGATIVE
NITRITE: NEGATIVE
PROTEIN: NEGATIVE mg/dL
Specific Gravity, Urine: 1.021 (ref 1.005–1.030)
pH: 6 (ref 5.0–8.0)

## 2016-12-02 LAB — LIPASE, BLOOD: LIPASE: 33 U/L (ref 11–51)

## 2016-12-02 LAB — POC OCCULT BLOOD, ED: Fecal Occult Bld: NEGATIVE

## 2016-12-02 MED ORDER — SODIUM CHLORIDE 0.9 % IV SOLN
Freq: Once | INTRAVENOUS | Status: AC
  Administered 2016-12-02: 04:00:00 via INTRAVENOUS

## 2016-12-02 MED ORDER — ACETAMINOPHEN 325 MG PO TABS
650.0000 mg | ORAL_TABLET | Freq: Once | ORAL | Status: AC
  Administered 2016-12-02: 650 mg via ORAL
  Filled 2016-12-02: qty 2

## 2016-12-02 MED ORDER — POTASSIUM CHLORIDE CRYS ER 20 MEQ PO TBCR: 20.0000 meq | EXTENDED_RELEASE_TABLET | Freq: Two times a day (BID) | ORAL | 0 refills | Status: DC

## 2016-12-02 MED ORDER — POTASSIUM CHLORIDE CRYS ER 20 MEQ PO TBCR
40.0000 meq | EXTENDED_RELEASE_TABLET | Freq: Once | ORAL | Status: AC
Start: 2016-12-02 — End: 2016-12-02
  Administered 2016-12-02: 40 meq via ORAL
  Filled 2016-12-02: qty 2

## 2016-12-02 MED ORDER — IOPAMIDOL (ISOVUE-300) INJECTION 61%
INTRAVENOUS | Status: AC
  Administered 2016-12-02: 100 mL via INTRAVENOUS
  Filled 2016-12-02: qty 100

## 2016-12-02 MED ORDER — IOPAMIDOL (ISOVUE-300) INJECTION 61%
100.0000 mL | Freq: Once | INTRAVENOUS | Status: AC | PRN
  Administered 2016-12-02: 100 mL via INTRAVENOUS

## 2016-12-02 NOTE — ED Notes (Signed)
Attempted to contact pt's brother per her request; no answer

## 2016-12-02 NOTE — ED Notes (Signed)
In ct  

## 2016-12-02 NOTE — ED Provider Notes (Addendum)
[facility] DEPT Provider Note: Lowella Dell, MD, FACEP  CSN: [id number] MRN: [id number] ARRIVAL: 12/02/16 at 0203 ROOM: WA25/WA25   CHIEF COMPLAINT  Diarrhea   HISTORY OF PRESENT ILLNESS  Tonya Maddox is a 83 y.o. female who has had an ongoing problem with back pain status post fall on December 29. She has been taking hydrocodone. She developed constipation from the hydrocodone was placed on Ex-Lax. She developed diarrhea as a result of the Ex-Lax. She was seen again in the ED on the 13th and was advised to discontinue the Ex-Lax as her abdominal film did not show any formed stool. She states she has not taken Ex-Lax since that time but had continued to pass watery stools. This morning she was having generalized abdominal pain. She subsequently passed a large semi-formed stool which has improved her abdominal pain. Her principal concern is that when she moved her bowels she noticed blood on the toilet tissue and is concerned that she is bleeding vaginally. The bleeding has not been heavy. The patient does admit to early dementia but is able to give a history, albeit somewhat rambling.  She continues to have pain in her back but has been switched from hydrocodone to Tylenol. She does not believe Tylenol is adequately treating the pain.   Past Medical History:  Diagnosis Date  . Anxiety   . Arrhythmia   . Arthritis    knees and right hip  . Bradycardia    2004  . Dementia 12/02/2016   Per patient, mild.  . Depression   . GERD (gastroesophageal reflux disease)   . Hard of hearing   . Hypertension   . Obesity (BMI 30-39.9)   . Syncope    2005  . Vertigo     Past Surgical History:  Procedure Laterality Date  . APPENDECTOMY    . CHOLECYSTECTOMY    . KNEE ARTHROSCOPY    . PERMANENT PACEMAKER INSERTION N/A 02/19/2013   Procedure: PERMANENT PACEMAKER INSERTION;  Surgeon: Marinus Maw, MD;  Location: [facility] CATH LAB;  Service: Cardiovascular;  Laterality: N/A;    Family  History  Problem Relation Age of Onset  . Heart Problems      died at 41    Social History  Substance Use Topics  . Smoking status: Never Smoker  . Smokeless tobacco: Never Used  . Alcohol use No    Prior to Admission medications   Medication Sig Start Date End Date Taking? Authorizing Provider  acetaminophen (TYLENOL) 500 MG tablet Take 1,000 mg by mouth every 6 (six) hours as needed for mild pain.   Yes Historical Provider, MD  ALPRAZolam (XANAX XR) 1 MG 24 hr tablet Take 1 mg by mouth daily.    Yes Historical Provider, MD  amLODipine (NORVASC) 10 MG tablet Take 10 mg by mouth daily.   Yes Historical Provider, MD  apixaban (ELIQUIS) 5 MG TABS tablet Take 5 mg by mouth 2 (two) times daily.   Yes Historical Provider, MD  cyanocobalamin 500 MCG tablet Take 500 mcg by mouth daily.   Yes Historical Provider, MD  esomeprazole (NEXIUM) 40 MG capsule Take 40 mg by mouth daily as needed (for acid reflux).    Yes Historical Provider, MD  fexofenadine (ALLEGRA) 180 MG tablet Take 180 mg by mouth daily as needed for allergies.   Yes Historical Provider, MD  fluticasone (FLONASE) 50 MCG/ACT nasal spray Place 1-2 sprays into both nostrils daily as needed for rhinitis.   Yes Historical Provider, MD  methocarbamol (ROBAXIN) 500 MG tablet Take 1 tablet (500 mg total) by mouth 3 (three) times daily between meals as needed for muscle spasms (back pain). 11/27/16  Yes Rolland Porter, MD  Multiple Vitamins-Minerals (PRESERVISION AREDS 2) CAPS Take 1 capsule by mouth 2 (two) times daily.   Yes Historical Provider, MD  Polyethyl Glycol-Propyl Glycol (SYSTANE OP) Apply 1 drop to eye daily as needed (dry eyes).    Yes Historical Provider, MD  triamterene-hydrochlorothiazide (MAXZIDE-25) 37.5-25 MG tablet Take 1 tablet by mouth daily.   Yes Historical Provider, MD  valsartan (DIOVAN) 320 MG tablet Take 320 mg by mouth daily.    Yes Historical Provider, MD  potassium chloride SA (K-DUR,KLOR-CON) 20 MEQ tablet Take 1  tablet (20 mEq total) by mouth 2 (two) times daily. 12/02/16   Paula Libra, MD    Allergies Aleve [naproxen sodium]; Aspirin; and Terazosin   REVIEW OF SYSTEMS  Negative except as noted here or in the History of Present Illness.   PHYSICAL EXAMINATION  Initial Vital Signs There were no vitals taken for this visit.  Examination General: Well-developed, well-nourished female in no acute distress; appearance consistent with age of record HENT: normocephalic; atraumatic Eyes: pupils equal, round and reactive to light; extraocular muscles intact Neck: supple Heart: regular rate and rhythm Lungs: clear to auscultation bilaterally Abdomen: soft; nondistended; mild diffuse tenderness; no masses or hepatosplenomegaly; bowel sounds present GU: Atrophic external genitalia; no blood in vagina Rectal: Normal sphincter tone; green stool on examining glove, heme-negative; no fecal impaction Extremities: No deformity; full range of motion; pulses normal Neurologic: Awake, alert; motor function intact in all extremities and symmetric; no facial droop Skin: Warm and dry Psychiatric: Normal mood and affect; circumstantial speech   RESULTS  Summary of this visit's results, reviewed by myself:   EKG Interpretation  Date/Time:    Ventricular Rate:    PR Interval:    QRS Duration:   QT Interval:    QTC Calculation:   R Axis:     Text Interpretation:        Laboratory Studies: Results for orders placed or performed during the hospital encounter of 12/02/16 (from the past 24 hour(s))  CBC with Differential/Platelet     Status: Abnormal   Collection Time: 12/02/16  2:32 AM  Result Value Ref Range   WBC 7.9 4.0 - 10.5 K/uL   RBC 4.55 3.87 - 5.11 MIL/uL   Hemoglobin 14.8 12.0 - 15.0 g/dL   HCT 16.1 09.6 - 04.5 %   MCV 89.2 78.0 - 100.0 fL   MCH 32.5 26.0 - 34.0 pg   MCHC 36.5 (H) 30.0 - 36.0 g/dL   RDW 40.9 81.1 - 91.4 %   Platelets 259 150 - 400 K/uL   Neutrophils Relative % 73 %    Neutro Abs 5.9 1.7 - 7.7 K/uL   Lymphocytes Relative 16 %   Lymphs Abs 1.2 0.7 - 4.0 K/uL   Monocytes Relative 10 %   Monocytes Absolute 0.8 0.1 - 1.0 K/uL   Eosinophils Relative 1 %   Eosinophils Absolute 0.0 0.0 - 0.7 K/uL   Basophils Relative 0 %   Basophils Absolute 0.0 0.0 - 0.1 K/uL  Comprehensive metabolic panel     Status: Abnormal   Collection Time: 12/02/16  2:32 AM  Result Value Ref Range   Sodium 121 (L) 135 - 145 mmol/L   Potassium 3.1 (L) 3.5 - 5.1 mmol/L   Chloride 86 (L) 101 - 111 mmol/L   CO2 25  22 - 32 mmol/L   Glucose, Bld 107 (H) 65 - 99 mg/dL   BUN 14 6 - 20 mg/dL   Creatinine, Ser 0.98 0.44 - 1.00 mg/dL   Calcium 8.7 (L) 8.9 - 10.3 mg/dL   Total Protein 6.2 (L) 6.5 - 8.1 g/dL   Albumin 4.2 3.5 - 5.0 g/dL   AST 20 15 - 41 U/L   ALT 14 14 - 54 U/L   Alkaline Phosphatase 92 38 - 126 U/L   Total Bilirubin 1.1 0.3 - 1.2 mg/dL   GFR calc non Af Amer >60 >60 mL/min   GFR calc Af Amer >60 >60 mL/min   Anion gap 10 5 - 15  Lipase, blood     Status: None   Collection Time: 12/02/16  2:32 AM  Result Value Ref Range   Lipase 33 11 - 51 U/L  Urinalysis, Routine w reflex microscopic     Status: Abnormal   Collection Time: 12/02/16  2:46 AM  Result Value Ref Range   Color, Urine YELLOW YELLOW   APPearance CLEAR CLEAR   Specific Gravity, Urine 1.021 1.005 - 1.030   pH 6.0 5.0 - 8.0   Glucose, UA NEGATIVE NEGATIVE mg/dL   Hgb urine dipstick NEGATIVE NEGATIVE   Bilirubin Urine NEGATIVE NEGATIVE   Ketones, ur 20 (A) NEGATIVE mg/dL   Protein, ur NEGATIVE NEGATIVE mg/dL   Nitrite NEGATIVE NEGATIVE   Leukocytes, UA NEGATIVE NEGATIVE  POC occult blood, ED     Status: None   Collection Time: 12/02/16  3:59 AM  Result Value Ref Range   Fecal Occult Bld NEGATIVE NEGATIVE  I-stat chem 8, ed     Status: Abnormal   Collection Time: 12/02/16  5:11 AM  Result Value Ref Range   Sodium 126 (L) 135 - 145 mmol/L   Potassium 3.0 (L) 3.5 - 5.1 mmol/L   Chloride 90 (L) 101 -  111 mmol/L   BUN 13 6 - 20 mg/dL   Creatinine, Ser 1.19 0.44 - 1.00 mg/dL   Glucose, Bld 78 65 - 99 mg/dL   Calcium, Ion 1.47 (L) 1.15 - 1.40 mmol/L   TCO2 23 0 - 100 mmol/L   Hemoglobin 12.9 12.0 - 15.0 g/dL   HCT 82.9 56.2 - 13.0 %   Imaging Studies: Ct Abdomen Pelvis W Contrast  Result Date: 12/02/2016 CLINICAL DATA:  83 year old with diffuse abdominal pain and diarrhea. EXAM: CT ABDOMEN AND PELVIS WITH CONTRAST TECHNIQUE: Multidetector CT imaging of the abdomen and pelvis was performed using the standard protocol following bolus administration of intravenous contrast. CONTRAST:  100 cc Isovue-300 IV COMPARISON:  CT 01/24/2008 FINDINGS: Lower chest: Borderline cardiomegaly. Pacemaker wires partially included. Moderate hiatal hernia. No pleural fluid or consolidation. Hepatobiliary: No focal hepatic lesion. Clips in the gallbladder fossa postcholecystectomy. No biliary dilatation. Pancreas: No ductal dilatation or inflammation. Spleen: Normal in size without focal abnormality. Adrenals/Urinary Tract: No adrenal nodule. Symmetric renal enhancement and excretion on delayed phase imaging. No hydronephrosis or perinephric edema. Urinary bladder is mildly distended but without wall thickening. Stomach/Bowel: Moderate hiatal hernia. Stomach is otherwise decompressed. No abnormal small bowel distention or inflammation. Colonic diverticulosis is most prominent in the sigmoid colon. No acute diverticulitis. Fluid within the ascending colon with mild wall enhancement. The appendix is not visualized. Vascular/Lymphatic: Abdominal atherosclerosis and tortuosity. No acute vascular abnormality. No adenopathy. Reproductive: Calcified uterine fibroids.  No adnexal mass. Other: No free air, free fluid, or intra-abdominal fluid collection. Musculoskeletal: Prominent Schmorl's node versus mild remote  compression fracture of inferior endplate of L1. Multilevel degenerative change in the lumbar spine. IMPRESSION: 1. Fluid  within the ascending colon with wall enhancement, may reflect mild ascending colitis. 2. No additional acute abnormality of the abdomen/pelvis. 3. Chronic findings include hiatal hernia, colonic diverticulosis without diverticulitis, atherosclerosis, and calcified uterine fibroids. Electronically Signed   By: Rubye OaksMelanie  Ehinger M.D.   On: 12/02/2016 04:48    ED COURSE  Nursing notes and initial vitals signs, including pulse oximetry, reviewed.  Vitals:   12/02/16 0330 12/02/16 0449 12/02/16 0533 12/02/16 0643  BP: 110/89 126/70 127/63 127/63  Pulse: (!) 59 60 (!) 59 61  Resp:  20 18 20   Temp:    97.6 F (36.4 C)  TempSrc:    Oral  SpO2: 99% 100% 100% 100%   4:52 AM Based on patient's history suspect she had a fecal impaction related to her hydrocodone use. Her recent history of liquid stools followed by a large semi-formed stool this morning suggests she passed the fecal impaction.  5:16 AM Patient's sodium is improved with hydration. She continues to complain of pain in her back and abdomen which she states is the same pain she has had since her injury in December. Again she notes that her pain is not being adequately controlled with Tylenol. Due to the patient's recent misadventure with hydrocodone I do not believe that it is in the best interest of the patient to put her back on a narcotic. At the patient's request her care was discussed with her brother, her primary caretaker with whom she lives.  PROCEDURES    ED DIAGNOSES     ICD-9-CM ICD-10-CM   1. Diarrhea in adult patient 787.91 R19.7   2. Hypokalemia due to loss of potassium 276.8 E87.6        Paula LibraJohn Ethin Drummond, MD 12/02/16 [id number]    Paula LibraJohn Calen Posch, MD 12/02/16 [id number]    Paula LibraJohn Avelynn Sellin, MD 12/02/16 [phone]

## 2016-12-02 NOTE — ED Notes (Signed)
Bed: WA25 Expected date:  Expected time:  Means of arrival:  Comments: EMS  

## 2016-12-02 NOTE — ED Triage Notes (Addendum)
Pt IB EMS from home c/o abdominal pain for past 3 weeks; reports pain is sharper today; pt is on laxatives and has had loose stools today; pt also noticed some spotting when she wiped and is unsure if it came from vagina or rectum; denies N/V

## 2016-12-02 NOTE — ED Notes (Signed)
POC Occult Blood Test Result: Negative

## 2016-12-02 NOTE — ED Notes (Signed)
Pt stated that if she is unable to answer questions (hx of dementia), that her brother, Tonya Maddox, would be able to answer questions [phone].

## 2016-12-20 ENCOUNTER — Ambulatory Visit: Payer: Medicare Other | Admitting: Family Medicine

## 2017-02-03 LAB — BASIC METABOLIC PANEL
BUN: 17 (ref 4–21)
CREATININE: 0.9 (ref 0.5–1.1)
Glucose: 82
Potassium: 4.1 (ref 3.4–5.3)
SODIUM: 133 — AB (ref 137–147)

## 2017-02-03 LAB — LIPID PANEL
Cholesterol: 162 (ref 0–200)
HDL: 66 (ref 35–70)
LDL Cholesterol: 86
TRIGLYCERIDES: 52 (ref 40–160)

## 2017-04-25 LAB — BASIC METABOLIC PANEL
BUN: 17 (ref 4–21)
Creatinine: 1 (ref 0.5–1.1)
Glucose: 96
POTASSIUM: 31 — AB (ref 3.4–5.3)
Sodium: 128 — AB (ref 137–147)

## 2017-04-25 LAB — HEPATIC FUNCTION PANEL
ALT: 19 (ref 7–35)
AST: 25 (ref 13–35)
Alkaline Phosphatase: 95 (ref 25–125)
BILIRUBIN, TOTAL: 0.9

## 2017-04-25 LAB — CBC AND DIFFERENTIAL
HEMATOCRIT: 44 (ref 36–46)
HEMOGLOBIN: 15.5 (ref 12.0–16.0)
PLATELETS: 203 (ref 150–399)
WBC: 7.5

## 2017-04-28 LAB — BASIC METABOLIC PANEL
BUN: 7 (ref 4–21)
Creatinine: 0.7 (ref 0.5–1.1)
Glucose: 91
Potassium: 4.4 (ref 3.4–5.3)
Sodium: 129 — AB (ref 137–147)

## 2017-04-28 LAB — CBC AND DIFFERENTIAL
HEMATOCRIT: 40 (ref 36–46)
Hemoglobin: 13.8 (ref 12.0–16.0)
PLATELETS: 165 (ref 150–399)
WBC: 7

## 2017-04-29 ENCOUNTER — Non-Acute Institutional Stay (SKILLED_NURSING_FACILITY): Payer: Medicare Other | Admitting: Internal Medicine

## 2017-04-29 ENCOUNTER — Encounter: Payer: Self-pay | Admitting: Internal Medicine

## 2017-04-29 DIAGNOSIS — N3090 Cystitis, unspecified without hematuria: Secondary | ICD-10-CM

## 2017-04-29 DIAGNOSIS — K219 Gastro-esophageal reflux disease without esophagitis: Secondary | ICD-10-CM

## 2017-04-29 DIAGNOSIS — G8929 Other chronic pain: Secondary | ICD-10-CM | POA: Insufficient documentation

## 2017-04-29 DIAGNOSIS — Z789 Other specified health status: Secondary | ICD-10-CM | POA: Insufficient documentation

## 2017-04-29 DIAGNOSIS — I499 Cardiac arrhythmia, unspecified: Secondary | ICD-10-CM | POA: Diagnosis not present

## 2017-04-29 DIAGNOSIS — K5901 Slow transit constipation: Secondary | ICD-10-CM | POA: Diagnosis not present

## 2017-04-29 DIAGNOSIS — E871 Hypo-osmolality and hyponatremia: Secondary | ICD-10-CM | POA: Diagnosis not present

## 2017-04-29 DIAGNOSIS — E876 Hypokalemia: Secondary | ICD-10-CM

## 2017-04-29 DIAGNOSIS — M810 Age-related osteoporosis without current pathological fracture: Secondary | ICD-10-CM | POA: Insufficient documentation

## 2017-04-29 DIAGNOSIS — I10 Essential (primary) hypertension: Secondary | ICD-10-CM | POA: Diagnosis not present

## 2017-04-29 DIAGNOSIS — Z950 Presence of cardiac pacemaker: Secondary | ICD-10-CM | POA: Diagnosis not present

## 2017-04-29 DIAGNOSIS — M549 Dorsalgia, unspecified: Secondary | ICD-10-CM

## 2017-04-29 DIAGNOSIS — R1084 Generalized abdominal pain: Secondary | ICD-10-CM

## 2017-04-29 DIAGNOSIS — H9190 Unspecified hearing loss, unspecified ear: Secondary | ICD-10-CM | POA: Insufficient documentation

## 2017-04-29 DIAGNOSIS — B9689 Other specified bacterial agents as the cause of diseases classified elsewhere: Secondary | ICD-10-CM | POA: Diagnosis not present

## 2017-04-29 DIAGNOSIS — F329 Major depressive disorder, single episode, unspecified: Secondary | ICD-10-CM | POA: Diagnosis not present

## 2017-04-29 DIAGNOSIS — I442 Atrioventricular block, complete: Secondary | ICD-10-CM | POA: Diagnosis not present

## 2017-04-29 DIAGNOSIS — F32A Depression, unspecified: Secondary | ICD-10-CM

## 2017-04-29 NOTE — Progress Notes (Signed)
: Provider:  Randon GoldsmithAnne D. Lyn HollingsheadAlexander, MD Location:  [facility] Room Number: 114P Place of Service:  SNF (31)  PCP: Devra DoppHowell, Tamieka, MD Patient Care Team: Devra DoppHowell, Tamieka, MD as PCP - General (Family Medicine)  Extended Emergency Contact Information Primary Emergency Contact: Detweiler,W.H. Address: [address]          [place], [place] [address] [place] Home Phone: [phone] Relation: Brother     Allergies: Influenza vaccines; Aleve [naproxen sodium]; Aspirin; and Terazosin  Chief Complaint  Patient presents with  . New Admit To SNF    Admit to SNF    HPI: Patient is 83 y.o. female with Hypertension, chronic back pain, depression, history of arrhythmia, who presented to the emergency department with complaint of mid lower abdominal pain associated with constipation. Patient had CT scan of abdomen done on 6/4 which was without any acute findings. Prescribed MiraLAX and Flagyl buttock, pain worsens O patient came to the emergency department. In ED patient was noted to be mildly hyponatremic and hypokalemic and CT scan showed stool in the transverse and ascending colon. She was admitted to [facility] from 6/11-14 where she was also noted to have a UTI and generalized weakness. Patient was admitted for ambulatory dysfunction, abdominal pain comes, UT I, and hyponatremia. Problems resolved with IV fluids and IV antibiotics and patient is now ready to be D/c to skilled nursing facility. While at skilled nursing facility patient would be followed for hypertension treated with Norvasc and Maxzide and Diovan, GERD treated with Nexium and history of arrhythmia/bradycardia treated with Eliquis.  Past Medical History:  Diagnosis Date  . Allergy history unknown   . Anxiety   . Arrhythmia   . Arthritis    knees and right hip  . Bradycardia    2004  . Chronic back pain   . Complete heart block (HCC) 02/2013  . Dementia 12/02/2016   Per  patient, mild.  . Depression   . GERD (gastroesophageal reflux disease)   . Hard of hearing   . Hearing difficulty   . Hypertension   . Obesity (BMI 30-39.9)   . Osteoporosis   . Pacemaker   . Syncope    2005  . Vertigo     Past Surgical History:  Procedure Laterality Date  . APPENDECTOMY    . CHOLECYSTECTOMY    . KNEE ARTHROSCOPY    . PERMANENT PACEMAKER INSERTION N/A 02/19/2013   Procedure: PERMANENT PACEMAKER INSERTION;  Surgeon: Marinus MawGregg W Taylor, MD;  [facility];  Service: Cardiovascular;  Laterality: N/A;    Allergies as of 04/29/2017      Reactions   Influenza Vaccines Rash   Aleve [naproxen Sodium] Other (See Comments)   Pt was advised by her MD not to take this medication.    Aspirin Other (See Comments)   Reaction:  Causes pts stomach to burn    Terazosin Other (See Comments)   Reaction:  Muscle pain       Medication List       Accurate as of 04/29/17 11:59 PM. Always use your most recent med list.          ALPRAZolam 1 MG 24 hr tablet Commonly known as:  XANAX XR Take 1 mg by mouth every morning.   amLODipine 10 MG tablet Commonly known as:  NORVASC Take 10 mg by mouth daily.   bisacodyl 10 MG suppository Commonly known as:  DULCOLAX Place 10 mg rectally  as needed for moderate constipation.   bismuth subsalicylate 262 MG chewable tablet Commonly known as:  PEPTO BISMOL Chew 262 mg by mouth every 6 (six) hours as needed.   cyanocobalamin 500 MCG tablet Take 500 mcg by mouth daily.   ELIQUIS 5 MG Tabs tablet Generic drug:  apixaban Take 5 mg by mouth 2 (two) times daily.   esomeprazole 40 MG capsule Commonly known as:  NEXIUM Take 40 mg by mouth daily before breakfast.   fexofenadine 180 MG tablet Commonly known as:  ALLEGRA Take 180 mg by mouth daily as needed for allergies.   FLUoxetine 10 MG capsule Commonly known as:  PROZAC Take 10 mg by mouth daily.   fluticasone 50 MCG/ACT nasal spray Commonly known as:  FLONASE Place  2 sprays into both nostrils daily.   HYDROcodone-acetaminophen 5-325 MG tablet Commonly known as:  NORCO/VICODIN Take 1 tablet by mouth every 8 (eight) hours as needed.   multivitamin with minerals tablet Take 1 tablet by mouth daily.   polyethylene glycol packet Commonly known as:  MIRALAX / GLYCOLAX Take 17 g by mouth daily as needed.   potassium chloride SA 20 MEQ tablet Commonly known as:  K-DUR,KLOR-CON Take 20 mEq by mouth daily.   SYSTANE OP Place 1 drop into both eyes daily before breakfast.   traMADol 50 MG tablet Commonly known as:  ULTRAM Take 50 mg by mouth every 6 (six) hours as needed.   triamterene-hydrochlorothiazide 37.5-25 MG tablet Commonly known as:  MAXZIDE-25 Take 1 tablet by mouth daily.   valsartan 320 MG tablet Commonly known as:  DIOVAN Take 320 mg by mouth daily before breakfast.       Meds ordered this encounter  Medications  . bisacodyl (DULCOLAX) 10 MG suppository    Sig: Place 10 mg rectally as needed for moderate constipation.  . polyethylene glycol (MIRALAX / GLYCOLAX) packet    Sig: Take 17 g by mouth daily as needed.  . potassium chloride SA (K-DUR,KLOR-CON) 20 MEQ tablet    Sig: Take 20 mEq by mouth daily.  Marland Kitchen bismuth subsalicylate (PEPTO BISMOL) 262 MG chewable tablet    Sig: Chew 262 mg by mouth every 6 (six) hours as needed.  . Multiple Vitamins-Minerals (MULTIVITAMIN WITH MINERALS) tablet    Sig: Take 1 tablet by mouth daily.  Marland Kitchen FLUoxetine (PROZAC) 10 MG capsule    Sig: Take 10 mg by mouth daily.  Marland Kitchen HYDROcodone-acetaminophen (NORCO/VICODIN) 5-325 MG tablet    Sig: Take 1 tablet by mouth every 8 (eight) hours as needed.  . traMADol (ULTRAM) 50 MG tablet    Sig: Take 50 mg by mouth every 6 (six) hours as needed.    Immunization History  Administered Date(s) Administered  . Pneumococcal Conjugate-13 12/16/2014  . Pneumococcal Polysaccharide-23 07/11/2013  . Td 02/06/2009    Social History  Substance Use Topics  .  Smoking status: Never Smoker  . Smokeless tobacco: Never Used  . Alcohol use No    Family history is   Family History  Problem Relation Age of Onset  . Heart Problems Unknown        died at 73  . Aneurysm Brother       Review of Systems  DATA OBTAINED: from patient, nurse GENERAL:  no fevers, fatigue, appetite changes SKIN: No itching, or rash EYES: No eye pain, redness, discharge EARS: No earache, tinnitus, change in hearing NOSE: No congestion, drainage or bleeding  MOUTH/THROAT: No mouth or tooth pain, No sore throat RESPIRATORY:  No cough, wheezing, SOB CARDIAC: No chest pain, palpitations, lower extremity edema  GI: No abdominal pain, No N/V/D or constipation, No heartburn or reflux  GU: No dysuria, frequency or urgency, or incontinence  MUSCULOSKELETAL: No unrelieved bone/joint pain NEUROLOGIC: No headache, dizziness or focal weakness PSYCHIATRIC: No c/o anxiety or sadness   Vitals:   04/29/17 0917  BP: (!) 105/96  Pulse: 92  Resp: 20  Temp: 97 F (36.1 C)    SpO2 Readings from Last 1 Encounters:  04/29/17 95%   Body mass index is 28.29 kg/m.     Physical Exam  GENERAL APPEARANCE: Alert,   No acute distress.  SKIN: No diaphoresis rash HEAD: Normocephalic, atraumatic  EYES: Conjunctiva/lids clear. Pupils round, reactive. EOMs intact.  EARS: External exam WNL, canals clear. Hearing grossly normal.  NOSE: No deformity or discharge.  MOUTH/THROAT: Lips w/o lesions  RESPIRATORY: Breathing is even, unlabored. Lung sounds are clear   CARDIOVASCULAR: Heart RRR no murmurs, rubs or gallops. No peripheral edema.   GASTROINTESTINAL: Abdomen is soft, non-tender, not distended w/ normal bowel sounds. GENITOURINARY: Bladder non tender, not distended  MUSCULOSKELETAL: No abnormal joints or musculature NEUROLOGIC:  Cranial nerves 2-12 grossly intact. Moves all extremities  PSYCHIATRIC: Mood and affect appropriate to situation, no behavioral issues  Patient Active  Problem List   Diagnosis Date Noted  . Abdominal pain 04/30/2017  . Constipation 04/30/2017  . Klebsiella cystitis 04/30/2017  . Hypokalemia 04/30/2017  . Chronic back pain   . Osteoporosis   . Allergy history unknown   . Pacemaker   . Hearing difficulty   . Chest pain 04/07/2016  . Atrial flutter (HCC) 04/07/2016  . Chronic anticoagulation 04/07/2016  . Hypertension 03/01/2013  . Hypercalcemia 03/01/2013  . S/P placement of cardiac pacemaker 02/20/2013  . Bradycardia 02/18/2013  . Hyponatremia 02/18/2013  . Chest tightness or pressure 02/18/2013  . Heart block AV third degree (HCC) 02/18/2013  . Depression   . Vertigo   . GERD (gastroesophageal reflux disease)   . Arrhythmia   . Anxiety   . Complete heart block (HCC) 02/13/2013      Labs reviewed: Basic Metabolic Panel:    Component Value Date/Time   NA 129 (A) 04/28/2017   K 4.4 04/28/2017   CL 90 (L) 12/02/2016 0511   CO2 25 12/02/2016 0232   GLUCOSE 78 12/02/2016 0511   BUN 7 04/28/2017   CREATININE 0.7 04/28/2017   CREATININE 0.50 12/02/2016 0511   CREATININE 0.89 03/01/2013 1147   CALCIUM 8.7 (L) 12/02/2016 0232   PROT 6.2 (L) 12/02/2016 0232   ALBUMIN 4.2 12/02/2016 0232   AST 25 04/25/2017   ALT 19 04/25/2017   ALKPHOS 95 04/25/2017   BILITOT 1.1 12/02/2016 0232   GFRNONAA >60 12/02/2016 0232   GFRAA >60 12/02/2016 0232     Recent Labs  12/02/16 0232 12/02/16 0511 02/03/17 04/25/17 04/28/17  NA 121* 126* 133* 128* 129*  K 3.1* 3.0* 4.1 31.0* 4.4  CL 86* 90*  --   --   --   CO2 25  --   --   --   --   GLUCOSE 107* 78  --   --   --   BUN 14 13 17 17 7   CREATININE 0.69 0.50 0.9 1.0 0.7  CALCIUM 8.7*  --   --   --   --    Liver Function Tests:  Recent Labs  12/02/16 0232 04/25/17  AST 20 25  ALT 14 19  ALKPHOS  92 95  BILITOT 1.1  --   PROT 6.2*  --   ALBUMIN 4.2  --     Recent Labs  12/02/16 0232  LIPASE 33   No results for input(s): AMMONIA in the last 8760  hours. CBC:  Recent Labs  12/02/16 0232 12/02/16 0511 04/25/17 04/28/17  WBC 7.9  --  7.5 7.0  NEUTROABS 5.9  --   --   --   HGB 14.8 12.9 15.5 13.8  HCT 40.6 38.0 44 40  MCV 89.2  --   --   --   PLT 259  --  203 165   Lipid  Recent Labs  02/03/17  CHOL 162  HDL 66  LDLCALC 86  TRIG 52   TSH - 0.83 Cardiac Enzymes: No results for input(s): CKTOTAL, CKMB, CKMBINDEX, TROPONINI in the last 8760 hours. BNP: No results for input(s): BNP in the last 8760 hours. No results found for: MICROALBUR No results found for: HGBA1C  No results found for: VITAMINB12 No results found for: FOLATE No results found for: IRON, TIBC, FERRITIN  Imaging and Procedures obtained prior to SNF admission: Ct Abdomen Pelvis W Contrast  Result Date: 12/02/2016 CLINICAL DATA:  83 year old with diffuse abdominal pain and diarrhea. EXAM: CT ABDOMEN AND PELVIS WITH CONTRAST TECHNIQUE: Multidetector CT imaging of the abdomen and pelvis was performed using the standard protocol following bolus administration of intravenous contrast. CONTRAST:  100 cc Isovue-300 IV COMPARISON:  CT 01/24/2008 FINDINGS: Lower chest: Borderline cardiomegaly. Pacemaker wires partially included. Moderate hiatal hernia. No pleural fluid or consolidation. Hepatobiliary: No focal hepatic lesion. Clips in the gallbladder fossa postcholecystectomy. No biliary dilatation. Pancreas: No ductal dilatation or inflammation. Spleen: Normal in size without focal abnormality. Adrenals/Urinary Tract: No adrenal nodule. Symmetric renal enhancement and excretion on delayed phase imaging. No hydronephrosis or perinephric edema. Urinary bladder is mildly distended but without wall thickening. Stomach/Bowel: Moderate hiatal hernia. Stomach is otherwise decompressed. No abnormal small bowel distention or inflammation. Colonic diverticulosis is most prominent in the sigmoid colon. No acute diverticulitis. Fluid within the ascending colon with mild wall  enhancement. The appendix is not visualized. Vascular/Lymphatic: Abdominal atherosclerosis and tortuosity. No acute vascular abnormality. No adenopathy. Reproductive: Calcified uterine fibroids.  No adnexal mass. Other: No free air, free fluid, or intra-abdominal fluid collection. Musculoskeletal: Prominent Schmorl's node versus mild remote compression fracture of inferior endplate of L1. Multilevel degenerative change in the lumbar spine. IMPRESSION: 1. Fluid within the ascending colon with wall enhancement, may reflect mild ascending colitis. 2. No additional acute abnormality of the abdomen/pelvis. 3. Chronic findings include hiatal hernia, colonic diverticulosis without diverticulitis, atherosclerosis, and calcified uterine fibroids. Electronically Signed   By: Rubye Oaks M.D.   On: 12/02/2016 04:48     Not all labs, radiology exams or other studies done during hospitalization come through on my EPIC note; however they are reviewed by me.    Assessment and Plan  ABDOMINAL PAIN /CONSTIPATION/KLEBSIELLA UTI- resolved with laxatives and IV Rocephin SNF - admit with generalized weakness for OT/PT  HYPONATREMIA/HYPOKALEMIA -review of records shows patient has been hyponatremic in the past; resolved with IV fluids and with replacement of potassium SNF - will f/u BMP; pt has been on no salt diet; I told her she needs SOME added salt; she will be a compliant pt; cont KCL 20 meq daily  DEPRESSION SNF - appears controlled; cont prozac 210 mg daily  HTN SNF - controlled continue Norvasc 10 mg daily Maxide/25 one pill daily and Diovan  320 mg 1 pill daily  HX ARHTHMIA/BRADYCARDIA -patient is status post pacemaker SNF - continue Eliquis 5 mg twice a day  GERD SNF - not stated as uncontrolled, continue Nexium 40 mg daily   Time spent > 45 min;> 50% of time with patient was spent reviewing records, labs, tests and studies, counseling and developing plan of care  Thurston Hole D. [name], MD

## 2017-04-30 ENCOUNTER — Encounter: Payer: Self-pay | Admitting: Internal Medicine

## 2017-04-30 DIAGNOSIS — N3090 Cystitis, unspecified without hematuria: Secondary | ICD-10-CM

## 2017-04-30 DIAGNOSIS — B9689 Other specified bacterial agents as the cause of diseases classified elsewhere: Secondary | ICD-10-CM | POA: Insufficient documentation

## 2017-04-30 DIAGNOSIS — E876 Hypokalemia: Secondary | ICD-10-CM

## 2017-04-30 DIAGNOSIS — R109 Unspecified abdominal pain: Secondary | ICD-10-CM | POA: Insufficient documentation

## 2017-04-30 DIAGNOSIS — K5900 Constipation, unspecified: Secondary | ICD-10-CM | POA: Insufficient documentation

## 2017-04-30 HISTORY — DX: Hypokalemia: E87.6

## 2017-05-09 ENCOUNTER — Other Ambulatory Visit: Payer: Self-pay | Admitting: *Deleted

## 2017-05-09 MED ORDER — ALPRAZOLAM ER 1 MG PO TB24: ORAL_TABLET | ORAL | 0 refills | Status: DC

## 2017-05-09 NOTE — Telephone Encounter (Signed)
[facility] #[phone] Fax: [phone]  

## 2017-05-13 ENCOUNTER — Non-Acute Institutional Stay (SKILLED_NURSING_FACILITY): Payer: Medicare Other | Admitting: Internal Medicine

## 2017-05-13 ENCOUNTER — Encounter: Payer: Self-pay | Admitting: Internal Medicine

## 2017-05-13 DIAGNOSIS — R198 Other specified symptoms and signs involving the digestive system and abdomen: Secondary | ICD-10-CM | POA: Diagnosis not present

## 2017-05-13 NOTE — Progress Notes (Signed)
Location:  [facility] Room Number: 114 Place of Service:  SNF [phone])  Provider: [name]. [name], MD  Patient Care Team: Devra Dopp, MD as PCP - General [facility] Medicine)  Extended Emergency Contact Information Primary Emergency Contact: Beevers,W.H. Address: [address]          [place], [place] [address] [place] Home Phone: [phone] Relation: Brother    Allergies: Influenza vaccines; Aleve [naproxen sodium]; Aspirin; and Terazosin  Chief Complaint  Patient presents with  . Acute Visit    HPI: Patient is 83 y.o. female who has asked to see me about her constipation alternating with diarrhea.Nurse staff states pt refuses her scheduled laxative, then when she gets constipated she takes a lot of laxative until she gets diarrhea. Nursing has tried to talk with her about this without success.Pt admits to abd cramping and boating, but can not trelate it to any particular foods.Patient says that she has had stomach problems since she was a child.  Past Medical History:  Diagnosis Date  . Allergy history unknown   . Anxiety   . Arrhythmia   . Arthritis    knees and right hip  . Bradycardia    2004  . Chronic back pain   . Complete heart block (HCC) 02/2013  . Dementia 12/02/2016   Per patient, mild.  . Depression   . GERD (gastroesophageal reflux disease)   . Hard of hearing   . Hearing difficulty   . Hypertension   . Obesity (BMI 30-39.9)   . Osteoporosis   . Pacemaker   . Syncope    2005  . Vertigo     Past Surgical History:  Procedure Laterality Date  . APPENDECTOMY    . CHOLECYSTECTOMY    . KNEE ARTHROSCOPY    . PERMANENT PACEMAKER INSERTION N/A 02/19/2013   Procedure: PERMANENT PACEMAKER INSERTION;  Surgeon: Marinus Maw, MD;  Location: [facility] CATH LAB;  Service: Cardiovascular;  Laterality: N/A;    Allergies as of 05/13/2017      Reactions   Influenza Vaccines Rash   Aleve [naproxen Sodium] Other (See  Comments)   Pt was advised by her MD not to take this medication.    Aspirin Other (See Comments)   Reaction:  Causes pts stomach to burn    Terazosin Other (See Comments)   Reaction:  Muscle pain       Medication List       Accurate as of 05/13/17  4:22 PM. Always use your most recent med list.          ALPRAZolam 1 MG 24 hr tablet Commonly known as:  XANAX XR Take one tablet by mouth every morning (control) (Do not crush)   amLODipine 10 MG tablet Commonly known as:  NORVASC Take 10 mg by mouth daily.   bisacodyl 10 MG suppository Commonly known as:  DULCOLAX Place 10 mg rectally as needed for moderate constipation.   bismuth subsalicylate 262 MG chewable tablet Commonly known as:  PEPTO BISMOL Chew 262 mg by mouth every 6 (six) hours as needed.   cyanocobalamin 500 MCG tablet Take 500 mcg by mouth daily.   ELIQUIS 5 MG Tabs tablet Generic drug:  apixaban Take 5 mg by mouth 2 (two) times daily.   esomeprazole 40 MG capsule Commonly known as:  NEXIUM Take 40 mg by mouth daily before breakfast.   fexofenadine 180 MG tablet Commonly known as:  ALLEGRA Take 180 mg by mouth  daily as needed for allergies.   FLUoxetine 10 MG capsule Commonly known as:  PROZAC Take 10 mg by mouth daily.   fluticasone 50 MCG/ACT nasal spray Commonly known as:  FLONASE Place 2 sprays into both nostrils daily.   HYDROcodone-acetaminophen 5-325 MG tablet Commonly known as:  NORCO/VICODIN Take 1 tablet by mouth every 8 (eight) hours as needed.   multivitamin with minerals tablet Take 1 tablet by mouth daily.   polyethylene glycol packet Commonly known as:  MIRALAX / GLYCOLAX Take 17 g by mouth daily as needed.   potassium chloride SA 20 MEQ tablet Commonly known as:  K-DUR,KLOR-CON Take 20 mEq by mouth daily.   SYSTANE OP Place 1 drop into both eyes daily before breakfast.   traMADol 50 MG tablet Commonly known as:  ULTRAM Take 50 mg by mouth every 6 (six) hours as  needed.   triamterene-hydrochlorothiazide 37.5-25 MG tablet Commonly known as:  MAXZIDE-25 Take 1 tablet by mouth daily.   valsartan 320 MG tablet Commonly known as:  DIOVAN Take 320 mg by mouth daily before breakfast.       No orders of the defined types were placed in this encounter.   Immunization History  Administered Date(s) Administered  . Pneumococcal Conjugate-13 12/16/2014  . Pneumococcal Polysaccharide-23 07/11/2013  . Td 02/06/2009    Social History  Substance Use Topics  . Smoking status: Never Smoker  . Smokeless tobacco: Never Used  . Alcohol use No    Review of Systems  DATA OBTAINED: from patient, nurse GENERAL:  no fevers, fatigue, appetite changes SKIN: No itching, rash HEENT: No complaint RESPIRATORY: No cough, wheezing, SOB CARDIAC: No chest pain, palpitations, lower extremity edema  GI: + abdominal pain, No N/V;  +D and  constipation, No heartburn or reflux  GU: No dysuria, frequency or urgency, or incontinence  MUSCULOSKELETAL: No unrelieved bone/joint pain NEUROLOGIC: No headache, dizziness  PSYCHIATRIC: No overt anxiety or sadness  Vitals:   05/13/17 1615  BP: 132/80  Pulse: 64  Resp: 18  Temp: 97.5 F (36.4 C)   Body mass index is 25.93 kg/m. Physical Exam  GENERAL APPEARANCE: Alert, conversant, No acute distress  SKIN: No diaphoresis rash HEENT: Unremarkable RESPIRATORY: Breathing is even, unlabored. Lung sounds are clear   CARDIOVASCULAR: Heart RRR no murmurs, rubs or gallops. No peripheral edema  GASTROINTESTINAL: Abdomen is soft, non-tender, not distended w/ normal bowel sounds.  GENITOURINARY: Bladder non tender, not distended  MUSCULOSKELETAL: No abnormal joints or musculature NEUROLOGIC: Cranial nerves 2-12 grossly intact. Moves all extremities PSYCHIATRIC: Mood and affect appropriate to situation, no behavioral issues  Patient Active Problem List   Diagnosis Date Noted  . Abdominal pain 04/30/2017  . Constipation  04/30/2017  . Klebsiella cystitis 04/30/2017  . Hypokalemia 04/30/2017  . Chronic back pain   . Osteoporosis   . Allergy history unknown   . Pacemaker   . Hearing difficulty   . Chest pain 04/07/2016  . Atrial flutter (HCC) 04/07/2016  . Chronic anticoagulation 04/07/2016  . Hypertension 03/01/2013  . Hypercalcemia 03/01/2013  . S/P placement of cardiac pacemaker 02/20/2013  . Bradycardia 02/18/2013  . Hyponatremia 02/18/2013  . Chest tightness or pressure 02/18/2013  . Heart block AV third degree (HCC) 02/18/2013  . Depression   . Vertigo   . GERD (gastroesophageal reflux disease)   . Arrhythmia   . Anxiety   . Complete heart block (HCC) 02/13/2013    CMP     Component Value Date/Time   NA 129 (  A) 04/28/2017   K 4.4 04/28/2017   CL 90 (L) 12/02/2016 0511   CO2 25 12/02/2016 0232   GLUCOSE 78 12/02/2016 0511   BUN 7 04/28/2017   CREATININE 0.7 04/28/2017   CREATININE 0.50 12/02/2016 0511   CREATININE 0.89 03/01/2013 1147   CALCIUM 8.7 (L) 12/02/2016 0232   PROT 6.2 (L) 12/02/2016 0232   ALBUMIN 4.2 12/02/2016 0232   AST 25 04/25/2017   ALT 19 04/25/2017   ALKPHOS 95 04/25/2017   BILITOT 1.1 12/02/2016 0232   GFRNONAA >60 12/02/2016 0232   GFRAA >60 12/02/2016 0232    Recent Labs  12/02/16 0232 12/02/16 0511 02/03/17 04/25/17 04/28/17  NA 121* 126* 133* 128* 129*  K 3.1* 3.0* 4.1 31.0* 4.4  CL 86* 90*  --   --   --   CO2 25  --   --   --   --   GLUCOSE 107* 78  --   --   --   BUN 14 13 17 17 7   CREATININE 0.69 0.50 0.9 1.0 0.7  CALCIUM 8.7*  --   --   --   --     Recent Labs  12/02/16 0232 04/25/17  AST 20 25  ALT 14 19  ALKPHOS 92 95  BILITOT 1.1  --   PROT 6.2*  --   ALBUMIN 4.2  --     Recent Labs  12/02/16 0232 12/02/16 0511 04/25/17 04/28/17  WBC 7.9  --  7.5 7.0  NEUTROABS 5.9  --   --   --   HGB 14.8 12.9 15.5 13.8  HCT 40.6 38.0 44 40  MCV 89.2  --   --   --   PLT 259  --  203 165    Recent Labs  02/03/17  CHOL 162    LDLCALC 86  TRIG 52   No results found for: MICROALBUR TSH - 0.863  3rd generation test No results found for: HGBA1C Lab Results  Component Value Date   CHOL 162 02/03/2017   HDL 66 02/03/2017   LDLCALC 86 02/03/2017   TRIG 52 02/03/2017   CHOLHDL 2.1 02/18/2013    Significant Diagnostic Results in last 30 days:  No results found.  Assessment and Plan  CONSTIPATION ALTERNATING WITH DIARRHEA-I spoke with patient regarding her use of laxatives and her refusal of laxatives on good days and she appears to have very little insight into the self perpetuating problem that this causes. The fact the patient has had problems like this when she since childhood makes me have concern for IBS as well. Have written consult for patient to see GI as an outpatient. Patient also asked me for Aquaphor lotion for her to use on her bottom after every bathroom use and this has been ordered as well.   Time spent > 25 min Falana Clagg D. Lyn HollingsheadAlexander, MD

## 2017-05-22 ENCOUNTER — Encounter: Payer: Self-pay | Admitting: Internal Medicine

## 2017-05-24 ENCOUNTER — Non-Acute Institutional Stay (SKILLED_NURSING_FACILITY): Payer: Medicare Other | Admitting: Internal Medicine

## 2017-05-24 ENCOUNTER — Encounter: Payer: Self-pay | Admitting: Internal Medicine

## 2017-05-24 DIAGNOSIS — K219 Gastro-esophageal reflux disease without esophagitis: Secondary | ICD-10-CM | POA: Diagnosis not present

## 2017-05-24 DIAGNOSIS — N3090 Cystitis, unspecified without hematuria: Secondary | ICD-10-CM

## 2017-05-24 DIAGNOSIS — I10 Essential (primary) hypertension: Secondary | ICD-10-CM | POA: Diagnosis not present

## 2017-05-24 DIAGNOSIS — Z950 Presence of cardiac pacemaker: Secondary | ICD-10-CM

## 2017-05-24 DIAGNOSIS — K5901 Slow transit constipation: Secondary | ICD-10-CM

## 2017-05-24 DIAGNOSIS — F32A Depression, unspecified: Secondary | ICD-10-CM

## 2017-05-24 DIAGNOSIS — F329 Major depressive disorder, single episode, unspecified: Secondary | ICD-10-CM | POA: Diagnosis not present

## 2017-05-24 DIAGNOSIS — E876 Hypokalemia: Secondary | ICD-10-CM

## 2017-05-24 DIAGNOSIS — B9689 Other specified bacterial agents as the cause of diseases classified elsewhere: Secondary | ICD-10-CM

## 2017-05-24 DIAGNOSIS — R001 Bradycardia, unspecified: Secondary | ICD-10-CM | POA: Diagnosis not present

## 2017-05-24 DIAGNOSIS — E871 Hypo-osmolality and hyponatremia: Secondary | ICD-10-CM | POA: Diagnosis not present

## 2017-05-24 DIAGNOSIS — R1084 Generalized abdominal pain: Secondary | ICD-10-CM

## 2017-05-24 NOTE — Progress Notes (Signed)
Location:  [facility] Room Number: [phone] Place of Service:  SNF [phone])  Provider: [name]. [name], MD  PCP: Devra Dopp, MD Patient Care Team: Devra Dopp, MD as PCP - General [facility] Medicine)  Extended Emergency Contact Information Primary Emergency Contact: Hark,W.H. Address: [address]          [place], [place] [address] [place] Home Phone: [phone] Relation: Brother  Allergies  Allergen Reactions  . Influenza Vaccines Rash  . Aleve [Naproxen Sodium] Other (See Comments)    Pt was advised by her MD not to take this medication.   . Aspirin Other (See Comments)    Reaction:  Causes pts stomach to burn   . Terazosin Other (See Comments)    Reaction:  Muscle pain     Chief Complaint  Patient presents with  . Discharge Note    discharge from SNF to home    HPI:  83 y.o. female  with Hypertension, chronic back pain, depression, history of arrhythmia, who presented to the emergency department with complaint of mid lower abdominal pain associated with constipation. Patient had CT scan of abdomen done on 6/4 which was without any acute findings. Prescribed MiraLAX and Flagyl buttock, pain worsens O patient came to the emergency department. In ED patient was noted to be mildly hyponatremic and hypokalemic and CT scan showed stool in the transverse and ascending colon. She was admitted to [facility] from 6/11-14 where she was also noted to have a UTI and generalized weakness. Patient was admitted for ambulatory dysfunction, abdominal pain comes, UT I, and hyponatremia. Problems resolved with IV fluids and IV antibiotics and patient is now admitted  to skilled nursing facility. Pt is now ready to bed/c to home.    Past Medical History:  Diagnosis Date  . Allergy history unknown   . Anxiety   . Arrhythmia   . Arthritis    knees and right hip  . Atrial flutter (HCC) 04/07/2016  . Bradycardia    2004  .  Chronic back pain   . Complete heart block (HCC) 02/2013  . Dementia 12/02/2016   Per patient, mild.  . Depression   . GERD (gastroesophageal reflux disease)   . Hard of hearing   . Hearing difficulty   . Heart block AV third degree (HCC) 02/18/2013  . Hypercalcemia 03/01/2013  . Hypertension   . Hyponatremia 02/18/2013  . Obesity (BMI 30-39.9)   . Osteoporosis   . Pacemaker   . S/P placement of cardiac pacemaker 02/20/2013   02/20/23 in setting of complete heart block   . Syncope    2005  . Vertigo     Past Surgical History:  Procedure Laterality Date  . APPENDECTOMY    . CHOLECYSTECTOMY    . KNEE ARTHROSCOPY    . PERMANENT PACEMAKER INSERTION N/A 02/19/2013   Procedure: PERMANENT PACEMAKER INSERTION;  Surgeon: Marinus Maw, MD;  Location: [facility] CATH LAB;  Service: Cardiovascular;  Laterality: N/A;     reports that she has never smoked. She has never used smokeless tobacco. She reports that she does not drink alcohol or use drugs. Social History   Social History  . Marital status: Single    Spouse name: N/A  . Number of children: N/A  . Years of education: N/A   Occupational History  . retired Glass blower/designer    Social History Main Topics  . Smoking status: Never Smoker  . Smokeless tobacco: Never Used  .  Alcohol use No  . Drug use: No  . Sexual activity: No   Other Topics Concern  . Not on file   Social History Narrative   Lives with brother   Used to work in Designer, fashion/clothingtextiles   Denies cigarettes, alcohol, other drugs    [facility]   DNR    Pertinent  Health Maintenance Due  Topic Date Due  . DEXA SCAN  06/14/1995  . INFLUENZA VACCINE  06/15/2017  . PNA vac Low Risk Adult  Completed    Medications: Allergies as of 05/24/2017      Reactions   Influenza Vaccines Rash   Aleve [naproxen Sodium] Other (See Comments)   Pt was advised by her MD not to take this medication.    Aspirin Other (See Comments)   Reaction:  Causes pts stomach to burn     Terazosin Other (See Comments)   Reaction:  Muscle pain       Medication List       Accurate as of 05/24/17 12:07 PM. Always use your most recent med list.          ALPRAZolam 1 MG 24 hr tablet Commonly known as:  XANAX XR Take one tablet by mouth every morning (control) (Do not crush)   amLODipine 10 MG tablet Commonly known as:  NORVASC Take 10 mg by mouth daily.   bisacodyl 10 MG suppository Commonly known as:  DULCOLAX Place 10 mg rectally as needed for moderate constipation.   bismuth subsalicylate 262 MG chewable tablet Commonly known as:  PEPTO BISMOL Chew 262 mg by mouth every 6 (six) hours as needed.   cyanocobalamin 500 MCG tablet Take 500 mcg by mouth daily.   ELIQUIS 5 MG Tabs tablet Generic drug:  apixaban Take 5 mg by mouth 2 (two) times daily.   esomeprazole 40 MG capsule Commonly known as:  NEXIUM Take 40 mg by mouth daily before breakfast.   fexofenadine 180 MG tablet Commonly known as:  ALLEGRA Take 180 mg by mouth daily as needed for allergies.   FLUoxetine 10 MG capsule Commonly known as:  PROZAC Take 10 mg by mouth daily.   fluticasone 50 MCG/ACT nasal spray Commonly known as:  FLONASE Place 2 sprays into both nostrils daily.   HYDROcodone-acetaminophen 5-325 MG tablet Commonly known as:  NORCO/VICODIN Take 1 tablet by mouth every 8 (eight) hours as needed.   multivitamin with minerals tablet Take 1 tablet by mouth daily.   polyethylene glycol packet Commonly known as:  MIRALAX / GLYCOLAX Take 17 g by mouth daily as needed.   potassium chloride SA 20 MEQ tablet Commonly known as:  K-DUR,KLOR-CON Take 20 mEq by mouth daily.   SYSTANE OP Place 1 drop into both eyes daily before breakfast.   traMADol 50 MG tablet Commonly known as:  ULTRAM Take 50 mg by mouth every 6 (six) hours as needed.   triamterene-hydrochlorothiazide 37.5-25 MG tablet Commonly known as:  MAXZIDE-25 Take 1 tablet by mouth daily.   valsartan 320 MG  tablet Commonly known as:  DIOVAN Take 320 mg by mouth daily before breakfast.        Vitals:   05/24/17 1132  BP: 111/77  Pulse: 60  Resp: 16  Temp: 97.6 F (36.4 C)  SpO2: 97%  Weight: 157 lb (71.2 kg)  Height: 5\' 3"  (1.6 m)   Body mass index is 27.81 kg/m.  Physical Exam  GENERAL APPEARANCE: Alert, conversant. No acute distress.  HEENT: Unremarkable. RESPIRATORY:  Breathing is even, unlabored. Lung sounds are clear   CARDIOVASCULAR: Heart RRR no murmurs, rubs or gallops. No peripheral edema.  GASTROINTESTINAL: Abdomen is soft, non-tender, not distended w/ normal bowel sounds.  NEUROLOGIC: Cranial nerves 2-12 grossly intact. Moves all extremities   Labs reviewed: Basic Metabolic Panel:  Recent Labs  16/10/96 0232 12/02/16 0511 02/03/17 04/25/17 04/28/17  NA 121* 126* 133* 128* 129*  K 3.1* 3.0* 4.1 31.0* 4.4  CL 86* 90*  --   --   --   CO2 25  --   --   --   --   GLUCOSE 107* 78  --   --   --   BUN 14 13 17 17 7   CREATININE 0.69 0.50 0.9 1.0 0.7  CALCIUM 8.7*  --   --   --   --    No results found for: [facility] Liver Function Tests:  Recent Labs  12/02/16 0232 04/25/17  AST 20 25  ALT 14 19  ALKPHOS 92 95  BILITOT 1.1  --   PROT 6.2*  --   ALBUMIN 4.2  --     Recent Labs  12/02/16 0232  LIPASE 33   No results for input(s): AMMONIA in the last 8760 hours. CBC:  Recent Labs  12/02/16 0232 12/02/16 0511 04/25/17 04/28/17  WBC 7.9  --  7.5 7.0  NEUTROABS 5.9  --   --   --   HGB 14.8 12.9 15.5 13.8  HCT 40.6 38.0 44 40  MCV 89.2  --   --   --   PLT 259  --  203 165   Lipid  Recent Labs  02/03/17  CHOL 162  HDL 66  LDLCALC 86  TRIG 52   Cardiac Enzymes: No results for input(s): CKTOTAL, CKMB, CKMBINDEX, TROPONINI in the last 8760 hours. BNP: No results for input(s): BNP in the last 8760 hours. CBG: No results for input(s): GLUCAP in the last 8760 hours.  Procedures and Imaging Studies During Stay: No results  found.  Assessment/Plan:   Generalized abdominal pain  Slow transit constipation  Klebsiella cystitis  Hypokalemia  Hyponatremia  Depression, unspecified depression type  Essential hypertension  Gastroesophageal reflux disease without esophagitis  Bradycardia  S/P placement of cardiac pacemaker   Patient is being discharged with the following home health services:  OT/PT/Nursing  Patient is being discharged with the following durable medical equipment:  none  Patient has been advised to f/u with their PCP in 1-2 weeks to bring them up to date on their rehab stay.  Social services at facility was responsible for arranging this appointment.  Pt was provided with a 30 day supply of prescriptions for medications and refills must be obtained from their PCP.  For controlled substances, a more limited supply may be provided adequate until PCP appointment only.  Medications-reconciled  Time spent> 30 min;> 50% of time with patient was spent reviewing records, labs, tests and studies, counseling and developing plan of care  [name]. [name], MD

## 2017-06-06 LAB — CBC AND DIFFERENTIAL
HEMATOCRIT: 39 (ref 36–46)
HEMOGLOBIN: 13.5 (ref 12.0–16.0)
Platelets: 171 (ref 150–399)
WBC: 9.6

## 2017-06-06 LAB — BASIC METABOLIC PANEL
BUN: 21 (ref 4–21)
Creatinine: 0.8 (ref 0.5–1.1)
GLUCOSE: 105
POTASSIUM: 4.8 (ref 3.4–5.3)
SODIUM: 124 — AB (ref 137–147)

## 2017-06-08 LAB — CBC AND DIFFERENTIAL
HCT: 36 (ref 36–46)
Hemoglobin: 12.4 (ref 12.0–16.0)
PLATELETS: 155 (ref 150–399)
WBC: 7.4

## 2017-06-08 LAB — BASIC METABOLIC PANEL
BUN: 23 — AB (ref 4–21)
CREATININE: 0.8 (ref 0.5–1.1)
Glucose: 97
Potassium: 4.5 (ref 3.4–5.3)
Sodium: 131 — AB (ref 137–147)

## 2017-06-08 LAB — HEPATIC FUNCTION PANEL
ALK PHOS: 77 (ref 25–125)
ALT: 20 (ref 7–35)
AST: 21 (ref 13–35)
Bilirubin, Total: 0.5

## 2017-06-09 ENCOUNTER — Non-Acute Institutional Stay (SKILLED_NURSING_FACILITY): Payer: Medicare Other | Admitting: Internal Medicine

## 2017-06-09 DIAGNOSIS — K219 Gastro-esophageal reflux disease without esophagitis: Secondary | ICD-10-CM

## 2017-06-09 DIAGNOSIS — Z7901 Long term (current) use of anticoagulants: Secondary | ICD-10-CM

## 2017-06-09 DIAGNOSIS — I10 Essential (primary) hypertension: Secondary | ICD-10-CM | POA: Diagnosis not present

## 2017-06-09 DIAGNOSIS — E871 Hypo-osmolality and hyponatremia: Secondary | ICD-10-CM

## 2017-06-09 DIAGNOSIS — E876 Hypokalemia: Secondary | ICD-10-CM

## 2017-06-09 DIAGNOSIS — I4892 Unspecified atrial flutter: Secondary | ICD-10-CM

## 2017-06-09 NOTE — Progress Notes (Signed)
: Provider:  [name]. [name], MD Location:  [facility] Room Number: 511 Place of Service:  SNF (31)  PCP: Devra Dopp, MD Patient Care Team: Devra Dopp, MD as PCP - General (Family Medicine)  Extended Emergency Contact Information Primary Emergency Contact: Drinkard,W.H. Address: [address]          [place], [place] [address] [place] Home Phone: [phone] Relation: Brother     Allergies: Influenza vaccines; Aleve [naproxen sodium]; Aspirin; and Terazosin  Chief Complaint  Patient presents with  . New Admit To SNF    following hospitalization 06/03/17 to 06/08/17 hyponatremia    HPI: Patient is 83 y.o. female with chronic back pain, iron deficiency anemia, hypertension, who presented to [facility] EGD with Gillermina Phy pain associated with constipation. Patient had a prescription for MiraLAX and Flagyl but she had not followed up with her primary care's physician. Patient complained of worsening abdominal pain but denied nausea vomiting or diarrhea. She did have gaseous distention she does have a history of IBS. In ED patient was noted to be hyponatremic and hypokalemic. She was afebrile, normotensive, saturating 100% on room air, and tachypnea or tachycardia, chest x-ray showed no congestion. Patient was admitted to the hospital from 7/20-25 for hyponatremia and hypokalemia. Patient was treated with IV fluids and replacements with good effect. Patient was seen by nephrology who discontinued her Prozac and her hydrochlorothiazide. Patient is admitted to skilled nursing facility with generalized weakness for OT/PT. While at skilled nursing facility patient will be followed for hypertension treated with Norvasc and Diovan, atrial flutter treated with Eliquis and GERD treated with omeprazole.  Past Medical History:  Diagnosis Date  . Allergy history unknown   . Anxiety   . Arrhythmia   . Arthritis    knees and  right hip  . Atrial flutter (HCC) 04/07/2016  . Bradycardia    2004  . Chronic anticoagulation 04/07/2016  . Chronic back pain   . Complete heart block (HCC) 02/2013  . Dementia 12/02/2016   Per patient, mild.  . Depression   . GERD (gastroesophageal reflux disease)   . Hard of hearing   . Hearing difficulty   . Heart block AV third degree (HCC) 02/18/2013  . Hypercalcemia 03/01/2013  . Hypertension   . Hypokalemia 04/30/2017  . Hyponatremia 02/18/2013  . Obesity (BMI 30-39.9)   . Osteoporosis   . Pacemaker   . S/P placement of cardiac pacemaker 02/20/2013   02/20/23 in setting of complete heart block   . Syncope    2005  . Vertigo     Past Surgical History:  Procedure Laterality Date  . APPENDECTOMY    . CHOLECYSTECTOMY    . KNEE ARTHROSCOPY    . PERMANENT PACEMAKER INSERTION N/A 02/19/2013   Procedure: PERMANENT PACEMAKER INSERTION;  Surgeon: Marinus Maw, MD;  Location: [facility] CATH LAB;  Service: Cardiovascular;  Laterality: N/A;    Allergies as of 06/09/2017      Reactions   Influenza Vaccines Rash   Aleve [naproxen Sodium] Other (See Comments)   Pt was advised by her MD not to take this medication.    Aspirin Other (See Comments)   Reaction:  Causes pts stomach to burn    Terazosin Other (See Comments)   Reaction:  Muscle pain       Medication List       Accurate as of 06/09/17  9:08 AM. Always use your most recent med  list.          ALPRAZolam 1 MG 24 hr tablet Commonly known as:  XANAX XR Take one tablet by mouth every morning (control) (Do not crush)   amLODipine 10 MG tablet Commonly known as:  NORVASC Take 10 mg by mouth daily.   bisacodyl 10 MG suppository Commonly known as:  DULCOLAX Place 10 mg rectally as needed for moderate constipation.   bismuth subsalicylate 262 MG chewable tablet Commonly known as:  PEPTO BISMOL Chew 262 mg by mouth every 6 (six) hours as needed.   cyanocobalamin 500 MCG tablet Take 500 mcg by mouth daily.   ELIQUIS 5 MG  Tabs tablet Generic drug:  apixaban Take 5 mg by mouth 2 (two) times daily.   esomeprazole 40 MG capsule Commonly known as:  NEXIUM Take 40 mg by mouth daily before breakfast.   fexofenadine 180 MG tablet Commonly known as:  ALLEGRA Take 180 mg by mouth daily as needed for allergies.   fluticasone 50 MCG/ACT nasal spray Commonly known as:  FLONASE Place 2 sprays into both nostrils daily.   HYDROcodone-acetaminophen 5-325 MG tablet Commonly known as:  NORCO/VICODIN Take 1 tablet by mouth every 8 (eight) hours as needed.   magnesium hydroxide 400 MG/5ML suspension Commonly known as:  MILK OF MAGNESIA Take by mouth. Take 30 ml by mouth daily as needed   multivitamin with minerals tablet Take 1 tablet by mouth daily.   polyethylene glycol packet Commonly known as:  MIRALAX / GLYCOLAX Take 17 g by mouth daily as needed.   potassium chloride SA 20 MEQ tablet Commonly known as:  K-DUR,KLOR-CON Take 20 mEq by mouth daily.   SYSTANE OP Place 1 drop into both eyes daily before breakfast.   valsartan 320 MG tablet Commonly known as:  DIOVAN Take 320 mg by mouth daily before breakfast.       Meds ordered this encounter  Medications  . magnesium hydroxide (MILK OF MAGNESIA) 400 MG/5ML suspension    Sig: Take by mouth. Take 30 ml by mouth daily as needed    Immunization History  Administered Date(s) Administered  . Pneumococcal Conjugate-13 12/16/2014  . Pneumococcal Polysaccharide-23 07/11/2013  . Td 02/06/2009    Social History  Substance Use Topics  . Smoking status: Never Smoker  . Smokeless tobacco: Never Used  . Alcohol use No    Family history is   Family History  Problem Relation Age of Onset  . Heart Problems Unknown        died at 72  . Aneurysm Brother       Review of Systems  DATA OBTAINED: from patient GENERAL:  no fevers, fatigue, appetite changes SKIN: No itching, or rash EYES: No eye pain, redness, discharge EARS: No earache,  tinnitus, change in hearing NOSE: No congestion, drainage or bleeding  MOUTH/THROAT: No mouth or tooth pain, No sore throat RESPIRATORY: No cough, wheezing, SOB CARDIAC: No chest pain, palpitations, lower extremity edema  GI: No abdominal pain, No N/V/D or constipation, No heartburn or reflux  GU: No dysuria, frequency or urgency, or incontinence  MUSCULOSKELETAL: No unrelieved bone/joint pain NEUROLOGIC: No headache, dizziness or focal weakness PSYCHIATRIC: No c/o anxiety or sadness   Vitals:   06/09/17 0847  BP: 120/64  Pulse: 78  Resp: 18  Temp: 98 F (36.7 C)    SpO2 Readings from Last 1 Encounters:  06/09/17 98%   Body mass index is 27.25 kg/m.     Physical Exam  GENERAL APPEARANCE: Alert,  conversant,  No acute distress.  SKIN: No diaphoresis rash HEAD: Normocephalic, atraumatic  EYES: Conjunctiva/lids clear. Pupils round, reactive. EOMs intact.  EARS: External exam WNL, canals clear. Hearing grossly normal.  NOSE: No deformity or discharge.  MOUTH/THROAT: Lips w/o lesions  RESPIRATORY: Breathing is even, unlabored. Lung sounds are clear   CARDIOVASCULAR: Heart RRR no murmurs, rubs or gallops. No peripheral edema.   GASTROINTESTINAL: Abdomen is soft, non-tender, not distended w/ normal bowel sounds. GENITOURINARY: Bladder non tender, not distended  MUSCULOSKELETAL: No abnormal joints or musculature NEUROLOGIC:  Cranial nerves 2-12 grossly intact. Moves all extremities  PSYCHIATRIC: Mood and affect appropriate to situation, no behavioral issues  Patient Active Problem List   Diagnosis Date Noted  . Abdominal pain 04/30/2017  . Constipation 04/30/2017  . Klebsiella cystitis 04/30/2017  . Hypokalemia 04/30/2017  . Chronic back pain   . Osteoporosis   . Allergy history unknown   . Pacemaker   . Hearing difficulty   . Chest pain 04/07/2016  . Atrial flutter (HCC) 04/07/2016  . Chronic anticoagulation 04/07/2016  . Hypertension 03/01/2013  . Hypercalcemia  03/01/2013  . S/P placement of cardiac pacemaker 02/20/2013  . Bradycardia 02/18/2013  . Hyponatremia 02/18/2013  . Chest tightness or pressure 02/18/2013  . Heart block AV third degree (HCC) 02/18/2013  . Depression   . Vertigo   . GERD (gastroesophageal reflux disease)   . Arrhythmia   . Anxiety   . Complete heart block (HCC) 02/13/2013      Labs reviewed: Basic Metabolic Panel:    Component Value Date/Time   NA 131 (A) 06/08/2017   K 4.5 06/08/2017   CL 90 (L) 12/02/2016 0511   CO2 25 12/02/2016 0232   GLUCOSE 78 12/02/2016 0511   BUN 23 (A) 06/08/2017   CREATININE 0.8 06/08/2017   CREATININE 0.50 12/02/2016 0511   CREATININE 0.89 03/01/2013 1147   CALCIUM 8.7 (L) 12/02/2016 0232   PROT 6.2 (L) 12/02/2016 0232   ALBUMIN 4.2 12/02/2016 0232   AST 21 06/08/2017   ALT 20 06/08/2017   ALKPHOS 77 06/08/2017   BILITOT 1.1 12/02/2016 0232   GFRNONAA >60 12/02/2016 0232   GFRAA >60 12/02/2016 0232     Recent Labs  12/02/16 0232 12/02/16 0511  04/28/17 06/06/17 06/08/17  NA 121* 126*  < > 129* 124* 131*  K 3.1* 3.0*  < > 4.4 4.8 4.5  CL 86* 90*  --   --   --   --   CO2 25  --   --   --   --   --   GLUCOSE 107* 78  --   --   --   --   BUN 14 13  < > 7 21 23*  CREATININE 0.69 0.50  < > 0.7 0.8 0.8  CALCIUM 8.7*  --   --   --   --   --   < > = values in this interval not displayed. Liver Function Tests:  Recent Labs  12/02/16 0232 04/25/17 06/08/17  AST 20 25 21   ALT 14 19 20   ALKPHOS 92 95 77  BILITOT 1.1  --   --   PROT 6.2*  --   --   ALBUMIN 4.2  --   --     Recent Labs  12/02/16 0232  LIPASE 33   No results for input(s): AMMONIA in the last 8760 hours. CBC:  Recent Labs  12/02/16 0232  04/28/17 06/06/17 06/08/17  WBC 7.9  < >  7.0 9.6 7.4  NEUTROABS 5.9  --   --   --   --   HGB 14.8  < > 13.8 13.5 12.4  HCT 40.6  < > 40 39 36  MCV 89.2  --   --   --   --   PLT 259  < > 165 171 155  < > = values in this interval not  displayed. Lipid  Recent Labs  02/03/17  CHOL 162  HDL 66  LDLCALC 86  TRIG 52    Cardiac Enzymes: No results for input(s): CKTOTAL, CKMB, CKMBINDEX, TROPONINI in the last 8760 hours. BNP: No results for input(s): BNP in the last 8760 hours. No results found for: MICROALBUR No results found for: HGBA1C  No results found for: VITAMINB12 No results found for: FOLATE No results found for: IRON, TIBC, FERRITIN TSH-0.863 Imaging and Procedures obtained prior to SNF admission: Ct Abdomen Pelvis W Contrast  Result Date: 12/02/2016 CLINICAL DATA:  83 year old with diffuse abdominal pain and diarrhea. EXAM: CT ABDOMEN AND PELVIS WITH CONTRAST TECHNIQUE: Multidetector CT imaging of the abdomen and pelvis was performed using the standard protocol following bolus administration of intravenous contrast. CONTRAST:  100 cc Isovue-300 IV COMPARISON:  CT 01/24/2008 FINDINGS: Lower chest: Borderline cardiomegaly. Pacemaker wires partially included. Moderate hiatal hernia. No pleural fluid or consolidation. Hepatobiliary: No focal hepatic lesion. Clips in the gallbladder fossa postcholecystectomy. No biliary dilatation. Pancreas: No ductal dilatation or inflammation. Spleen: Normal in size without focal abnormality. Adrenals/Urinary Tract: No adrenal nodule. Symmetric renal enhancement and excretion on delayed phase imaging. No hydronephrosis or perinephric edema. Urinary bladder is mildly distended but without wall thickening. Stomach/Bowel: Moderate hiatal hernia. Stomach is otherwise decompressed. No abnormal small bowel distention or inflammation. Colonic diverticulosis is most prominent in the sigmoid colon. No acute diverticulitis. Fluid within the ascending colon with mild wall enhancement. The appendix is not visualized. Vascular/Lymphatic: Abdominal atherosclerosis and tortuosity. No acute vascular abnormality. No adenopathy. Reproductive: Calcified uterine fibroids.  No adnexal mass. Other: No free  air, free fluid, or intra-abdominal fluid collection. Musculoskeletal: Prominent Schmorl's node versus mild remote compression fracture of inferior endplate of L1. Multilevel degenerative change in the lumbar spine. IMPRESSION: 1. Fluid within the ascending colon with wall enhancement, may reflect mild ascending colitis. 2. No additional acute abnormality of the abdomen/pelvis. 3. Chronic findings include hiatal hernia, colonic diverticulosis without diverticulitis, atherosclerosis, and calcified uterine fibroids. Electronically Signed   By: Rubye OaksMelanie  Ehinger M.D.   On: 12/02/2016 04:48     Not all labs, radiology exams or other studies done during hospitalization come through on my EPIC note; however they are reviewed by me.    Assessment and Plan  HYPONATREMIA/HYPOKALEMIA-treated with IV fluids and replacement of potassium; was seen by nephrology who recommended DC Prozac and hydrochlorothiazide; DC sodium was 131, and DC potassium was 4.5 SNF -will follow-up BMP  ATRIAL FLUTTER SNF -patient in normal sinus rhythm; plan to continue Eliquis 5 mg by mouth twice a day  HYPERTENSION SNF -controlled plan to continue Norvasc 10 mg by mouth daily and Diovan 320 mg by mouth daily  GERD SNF - patient has no complaint of; continue Nexium 40 mg by mouth daily   Time spent greater than 35 minutes;> 50% of time with patient was spent reviewing records, labs, tests and studies, counseling and developing plan of care Thurston Holenne D. Lyn HollingsheadAlexander, MD

## 2017-06-13 LAB — BASIC METABOLIC PANEL
BUN: 14 (ref 4–21)
Creatinine: 0.9 (ref <=1.1)
Glucose: 89
Potassium: 4.2 (ref 3.4–5.3)
SODIUM: 134 — AB (ref 137–147)

## 2017-06-14 ENCOUNTER — Other Ambulatory Visit: Payer: Self-pay | Admitting: *Deleted

## 2017-06-20 LAB — BASIC METABOLIC PANEL: SODIUM: 139 (ref 137–147)

## 2017-06-22 ENCOUNTER — Other Ambulatory Visit: Payer: Self-pay | Admitting: Internal Medicine

## 2017-06-23 ENCOUNTER — Other Ambulatory Visit: Payer: Self-pay

## 2017-06-23 ENCOUNTER — Other Ambulatory Visit: Payer: Self-pay | Admitting: Internal Medicine

## 2017-06-27 ENCOUNTER — Other Ambulatory Visit: Payer: Self-pay | Admitting: Internal Medicine

## 2017-07-01 ENCOUNTER — Non-Acute Institutional Stay (SKILLED_NURSING_FACILITY): Payer: Medicare Other | Admitting: Internal Medicine

## 2017-07-01 ENCOUNTER — Encounter: Payer: Self-pay | Admitting: Internal Medicine

## 2017-07-01 DIAGNOSIS — K219 Gastro-esophageal reflux disease without esophagitis: Secondary | ICD-10-CM | POA: Diagnosis not present

## 2017-07-01 DIAGNOSIS — Z8679 Personal history of other diseases of the circulatory system: Secondary | ICD-10-CM | POA: Diagnosis not present

## 2017-07-01 DIAGNOSIS — E871 Hypo-osmolality and hyponatremia: Secondary | ICD-10-CM

## 2017-07-01 DIAGNOSIS — E876 Hypokalemia: Secondary | ICD-10-CM | POA: Diagnosis not present

## 2017-07-01 DIAGNOSIS — N3090 Cystitis, unspecified without hematuria: Secondary | ICD-10-CM

## 2017-07-01 DIAGNOSIS — F32A Depression, unspecified: Secondary | ICD-10-CM

## 2017-07-01 DIAGNOSIS — R1084 Generalized abdominal pain: Secondary | ICD-10-CM

## 2017-07-01 DIAGNOSIS — Z950 Presence of cardiac pacemaker: Secondary | ICD-10-CM

## 2017-07-01 DIAGNOSIS — I10 Essential (primary) hypertension: Secondary | ICD-10-CM

## 2017-07-01 DIAGNOSIS — K5901 Slow transit constipation: Secondary | ICD-10-CM | POA: Diagnosis not present

## 2017-07-01 DIAGNOSIS — B9689 Other specified bacterial agents as the cause of diseases classified elsewhere: Secondary | ICD-10-CM

## 2017-07-01 DIAGNOSIS — F329 Major depressive disorder, single episode, unspecified: Secondary | ICD-10-CM | POA: Diagnosis not present

## 2017-07-01 DIAGNOSIS — Z87898 Personal history of other specified conditions: Secondary | ICD-10-CM

## 2017-07-01 DIAGNOSIS — I499 Cardiac arrhythmia, unspecified: Secondary | ICD-10-CM

## 2017-07-01 NOTE — Progress Notes (Signed)
Location:  [facility]   Place of Service:  SNF (31)skilled nursing facility Margit Hanks, M.D.  PCP: Devra Dopp, MD Patient Care Team: Devra Dopp, MD as PCP - General [facility])  Extended Emergency Contact Information Primary Emergency Contact: Esbenshade,W.H. Address: [address]          [place], [place] [address] [place] Home Phone: [phone] Relation: Brother  Allergies  Allergen Reactions  . Influenza Vaccines Rash  . Aleve [Naproxen Sodium] Other (See Comments)    Pt was advised by her MD not to take this medication.   . Aspirin Other (See Comments)    Reaction:  Causes pts stomach to burn   . Terazosin Other (See Comments)    Reaction:  Muscle pain     Chief Complaint  Patient presents with  . Discharge Note    HPI:  83 y.o. female with Hypertension, chronic back pain, depression, history of arrhythmia, who presented to the emergency department with complaint of mid lower abdominal pain associated with constipation. Patient had CT scan of abdomen done on 6/4 which was without any acute findings. Prescribed MiraLAX and Flagyl buttock, pain worsens O patient came to the emergency department. In ED patient was noted to be mildly hyponatremic and hypokalemic and CT scan showed stool in the transverse and ascending colon. She was admitted to [facility] from 6/11-14 where she was also noted to have a UTI and generalized weakness. Patient was admitted for ambulatory dysfunction, abdominal pain comes, UT I, and hyponatremia. Problems resolved with IV fluids and IV antibiotics and patient was  ready to be D/c to skilled nursing facility.Patient is now ready to be discharged to home . Patient developed an Escherichia coli UTI pansensitive which treatment will be finished before discharge.       Past Medical History:  Diagnosis Date  . Allergy history unknown   . Anxiety   . Arrhythmia   . Arthritis     knees and right hip  . Atrial flutter (HCC) 04/07/2016  . Bradycardia    2004  . Chronic anticoagulation 04/07/2016  . Chronic back pain   . Complete heart block (HCC) 02/2013  . Dementia 12/02/2016   Per patient, mild.  . Depression   . GERD (gastroesophageal reflux disease)   . Hard of hearing   . Hearing difficulty   . Heart block AV third degree (HCC) 02/18/2013  . Hypercalcemia 03/01/2013  . Hypertension   . Hypokalemia 04/30/2017  . Hyponatremia 02/18/2013  . Obesity (BMI 30-39.9)   . Osteoporosis   . Pacemaker   . S/P placement of cardiac pacemaker 02/20/2013   02/20/23 in setting of complete heart block   . Syncope    2005  . Vertigo     Past Surgical History:  Procedure Laterality Date  . APPENDECTOMY    . CHOLECYSTECTOMY    . KNEE ARTHROSCOPY    . PERMANENT PACEMAKER INSERTION N/A 02/19/2013   Procedure: PERMANENT PACEMAKER INSERTION;  Surgeon: Marinus Maw, MD;  Location: [facility] CATH LAB;  Service: Cardiovascular;  Laterality: N/A;     reports that she has never smoked. She has never used smokeless tobacco. She reports that she does not drink alcohol or use drugs. Social History   Social History  . Marital status: Single    Spouse name: N/A  . Number of children: N/A  . Years of education: N/A   Occupational History  . retired Glass blower/designer  Social History Main Topics  . Smoking status: Never Smoker  . Smokeless tobacco: Never Used  . Alcohol use No  . Drug use: No  . Sexual activity: No   Other Topics Concern  . Not on file   Social History Narrative   Lives with brother   Used to work in Designer, fashion/clothing   Denies cigarettes, alcohol, other drugs    Admitted to [facility] 04/28/17   DNR    Pertinent  Health Maintenance Due  Topic Date Due  . DEXA SCAN  06/14/1995  . INFLUENZA VACCINE  06/15/2017  . PNA vac Low Risk Adult  Completed    Medications: Allergies as of 07/01/2017      Reactions   Influenza Vaccines Rash   Aleve [naproxen Sodium] Other (See  Comments)   Pt was advised by her MD not to take this medication.    Aspirin Other (See Comments)   Reaction:  Causes pts stomach to burn    Terazosin Other (See Comments)   Reaction:  Muscle pain       Medication List       Accurate as of 07/01/17 11:47 AM. Always use your most recent med list.          ALPRAZolam 1 MG 24 hr tablet Commonly known as:  XANAX XR Take one tablet by mouth every morning (control) (Do not crush)   amLODipine 10 MG tablet Commonly known as:  NORVASC Take 10 mg by mouth daily.   bisacodyl 10 MG suppository Commonly known as:  DULCOLAX Place 10 mg rectally as needed for moderate constipation.   bismuth subsalicylate 262 MG chewable tablet Commonly known as:  PEPTO BISMOL Chew 262 mg by mouth every 6 (six) hours as needed.   cyanocobalamin 500 MCG tablet Take 500 mcg by mouth daily.   ELIQUIS 5 MG Tabs tablet Generic drug:  apixaban Take 5 mg by mouth 2 (two) times daily.   esomeprazole 40 MG capsule Commonly known as:  NEXIUM Take 40 mg by mouth daily before breakfast.   fexofenadine 180 MG tablet Commonly known as:  ALLEGRA Take 180 mg by mouth daily as needed for allergies.   fluticasone 50 MCG/ACT nasal spray Commonly known as:  FLONASE Place 2 sprays into both nostrils daily.   HYDROcodone-acetaminophen 5-325 MG tablet Commonly known as:  NORCO/VICODIN Take 1 tablet by mouth every 8 (eight) hours as needed.   magnesium hydroxide 400 MG/5ML suspension Commonly known as:  MILK OF MAGNESIA Take by mouth. Take 30 ml by mouth daily as needed   multivitamin with minerals tablet Take 1 tablet by mouth daily.   polyethylene glycol packet Commonly known as:  MIRALAX / GLYCOLAX Take 17 g by mouth daily as needed.   potassium chloride SA 20 MEQ tablet Commonly known as:  K-DUR,KLOR-CON Take 20 mEq by mouth daily.   SYSTANE OP Place 1 drop into both eyes daily before breakfast.   valsartan 320 MG tablet Commonly known as:   DIOVAN Take 320 mg by mouth daily before breakfast.        Vitals:   07/01/17 1118  BP: 120/70  Pulse: 60  Resp: 18  Temp: 97.7 F (36.5 C)  SpO2: 97%   There is no height or weight on file to calculate BMI.  Physical Exam  GENERAL APPEARANCE: Alert, conversant. No acute distress.  HEENT: Unremarkable. RESPIRATORY: Breathing is even, unlabored. Lung sounds are clear   CARDIOVASCULAR: Heart RRR no murmurs, rubs or gallops. No peripheral  edema.  GASTROINTESTINAL: Abdomen is soft, non-tender, not distended w/ normal bowel sounds.  NEUROLOGIC: Cranial nerves 2-12 grossly intact. Moves all extremities   Labs reviewed: Basic Metabolic Panel:  Recent Labs  16/10/96 0232 12/02/16 0511  06/06/17 06/08/17 06/13/17 06/20/17  NA 121* 126*  < > 124* 131* 134* 139  K 3.1* 3.0*  < > 4.8 4.5 4.2  --   CL 86* 90*  --   --   --   --   --   CO2 25  --   --   --   --   --   --   GLUCOSE 107* 78  --   --   --   --   --   BUN 14 13  < > 21 23* 14  --   CREATININE 0.69 0.50  < > 0.8 0.8 0.9  --   CALCIUM 8.7*  --   --   --   --   --   --   < > = values in this interval not displayed. No results found for: [facility] Liver Function Tests:  Recent Labs  12/02/16 0232 04/25/17 06/08/17  AST 20 25 21   ALT 14 19 20   ALKPHOS 92 95 77  BILITOT 1.1  --   --   PROT 6.2*  --   --   ALBUMIN 4.2  --   --     Recent Labs  12/02/16 0232  LIPASE 33   No results for input(s): AMMONIA in the last 8760 hours. CBC:  Recent Labs  12/02/16 0232  04/28/17 06/06/17 06/08/17  WBC 7.9  < > 7.0 9.6 7.4  NEUTROABS 5.9  --   --   --   --   HGB 14.8  < > 13.8 13.5 12.4  HCT 40.6  < > 40 39 36  MCV 89.2  --   --   --   --   PLT 259  < > 165 171 155  < > = values in this interval not displayed. Lipid  Recent Labs  02/03/17  CHOL 162  HDL 66  LDLCALC 86  TRIG 52   Cardiac Enzymes: No results for input(s): CKTOTAL, CKMB, CKMBINDEX, TROPONINI in the last 8760 hours. BNP: No results for  input(s): BNP in the last 8760 hours. CBG: No results for input(s): GLUCAP in the last 8760 hours.  Procedures and Imaging Studies During Stay: No results found.  Assessment/Plan:   Slow transit constipation  Klebsiella cystitis  Generalized abdominal pain  Hyponatremia  Hypokalemia  Depression, unspecified depression type  S/P placement of cardiac pacemaker  Essential hypertension  Cardiac arrhythmia, unspecified cardiac arrhythmia type  History of bradycardia  Gastroesophageal reflux disease without esophagitis   Patient is being discharged with the following home health services: OT/PT/nursing   Patient is being discharged with the following durable medical equipment:  None  Patient has been advised to f/u with their PCP in 1-2 weeks to bring them up to date on their rehab stay.  Social services at facility was responsible for arranging this appointment.  Pt was provided with a 30 day supply of prescriptions for medications and refills must be obtained from their PCP.  For controlled substances, a more limited supply may be provided adequate until PCP appointment only.  Medications have been reconciled.   Time spent greater than 30 minutes;> 50% of time with patient was spent reviewing records, labs, tests and studies, counseling and developing plan of care  Thurston Hole  Sheppard Coil, MD

## 2017-09-03 LAB — POCT INR: INR: 1.3 — AB (ref <=1.1)

## 2017-09-29 DIAGNOSIS — R0602 Shortness of breath: Secondary | ICD-10-CM

## 2017-09-29 DIAGNOSIS — J811 Chronic pulmonary edema: Secondary | ICD-10-CM

## 2017-09-29 HISTORY — DX: Chronic pulmonary edema: J81.1

## 2017-09-29 HISTORY — DX: Shortness of breath: R06.02

## 2017-10-06 LAB — HEPATIC FUNCTION PANEL
Alkaline Phosphatase: 93 (ref 25–125)
BILIRUBIN, TOTAL: 0.5

## 2017-10-10 LAB — CBC AND DIFFERENTIAL
HCT: 34 — AB (ref 36–46)
HEMOGLOBIN: 11.4 — AB (ref 12.0–16.0)
PLATELETS: 297 (ref 150–399)
WBC: 7.8

## 2017-10-10 LAB — BASIC METABOLIC PANEL
BUN: 24 — AB (ref 4–21)
Creatinine: 0.8 (ref 0.5–1.1)
POTASSIUM: 4.1 (ref 3.4–5.3)
SODIUM: 131 — AB (ref 137–147)

## 2017-10-10 LAB — HEPATIC FUNCTION PANEL
ALT: 20 (ref 7–35)
AST: 19 (ref 13–35)

## 2017-10-12 ENCOUNTER — Encounter: Payer: Self-pay | Admitting: Internal Medicine

## 2017-10-12 ENCOUNTER — Non-Acute Institutional Stay (SKILLED_NURSING_FACILITY): Payer: Medicare Other | Admitting: Internal Medicine

## 2017-10-12 DIAGNOSIS — E538 Deficiency of other specified B group vitamins: Secondary | ICD-10-CM

## 2017-10-12 DIAGNOSIS — E871 Hypo-osmolality and hyponatremia: Secondary | ICD-10-CM

## 2017-10-12 DIAGNOSIS — R338 Other retention of urine: Secondary | ICD-10-CM | POA: Diagnosis not present

## 2017-10-12 DIAGNOSIS — I071 Rheumatic tricuspid insufficiency: Secondary | ICD-10-CM | POA: Diagnosis not present

## 2017-10-12 DIAGNOSIS — I10 Essential (primary) hypertension: Secondary | ICD-10-CM

## 2017-10-12 DIAGNOSIS — I50813 Acute on chronic right heart failure: Secondary | ICD-10-CM | POA: Diagnosis not present

## 2017-10-12 DIAGNOSIS — K219 Gastro-esophageal reflux disease without esophagitis: Secondary | ICD-10-CM | POA: Diagnosis not present

## 2017-10-12 DIAGNOSIS — I270 Primary pulmonary hypertension: Secondary | ICD-10-CM

## 2017-10-12 NOTE — Progress Notes (Signed)
: Provider:  [name]. [name], MD Location:  [facility] Room Number: 104 Place of Service:  SNF (31)    PCP: Devra Dopp, MD Patient Care Team: Devra Dopp, MD as PCP - General (Family Medicine)  Extended Emergency Contact Information Primary Emergency Contact: Curington,W.H. Address: [address]          [place], [place] [address] [place] Home Phone: [phone] Relation: Brother     Allergies: Influenza vaccines; Aleve [naproxen sodium]; Aspirin; and Terazosin  Chief Complaint  Patient presents with  . New Admit To SNF    following hospitalization 09/29/17 to 10/11/17 chest pain and shortness of breath.    HPI: Patient is 83 y.o. female with complete heart block, hypertension, anxiety, chronic back pain, who presented to [facility] ED with chest pain and shortness of breath. She was noted be mildly hypoxic in the ED and chest x-ray showed possible pulmonary edema and small pleural effusion. Patient was also noted to have significant hyponatremia. Patient was admitted to [facility] from 11/15-27 where she was treated for acute on chronic congestive heart failure and pulmonary hypertension with IV Lasix and hyponatremia eventually was controlled with the administration of sodium bicarbonate tablets. Hospital course was complicated by acute urinary retention with the placement of a Foley and a failed voiding trial. Patient is admitted to skilled nursing facility with generalized weakness for OT/PT. While at skilled nursing facility patient will be followed for hypertension treated with valsartan, GERD treated with Nexium and B12 deficiency treated with replacement.  Past Medical History:  Diagnosis Date  . Allergy history unknown   . Anxiety   . Arrhythmia   . Arthritis    knees and right hip  . Atrial flutter (HCC) 04/07/2016  . Bradycardia    2004  . Chronic anticoagulation 04/07/2016  . Chronic  back pain   . Complete heart block (HCC) 02/2013  . Dementia 12/02/2016   Per patient, mild.  . Depression   . GERD (gastroesophageal reflux disease)   . Hard of hearing   . Hearing difficulty   . Heart block AV third degree (HCC) 02/18/2013  . Hypercalcemia 03/01/2013  . Hypertension   . Hypokalemia 04/30/2017  . Hyponatremia 02/18/2013  . Iron deficiency anemia   . Obesity (BMI 30-39.9)   . Osteoporosis   . Pacemaker   . Pulmonary edema 09/29/2017  . S/P placement of cardiac pacemaker 02/20/2013   02/20/23 in setting of complete heart block   . Sepsis (HCC)   . Shortness of breath 09/29/2017  . Syncope    2005  . Vertigo     Past Surgical History:  Procedure Laterality Date  . APPENDECTOMY    . CHOLECYSTECTOMY    . KNEE ARTHROSCOPY    . PERMANENT PACEMAKER INSERTION N/A 02/19/2013   Procedure: PERMANENT PACEMAKER INSERTION;  Surgeon: Marinus Maw, MD;  Location: [facility] CATH LAB;  Service: Cardiovascular;  Laterality: N/A;    Allergies as of 10/12/2017      Reactions   Influenza Vaccines Rash   Aleve [naproxen Sodium] Other (See Comments)   Pt was advised by her MD not to take this medication.    Aspirin Other (See Comments)   Reaction:  Causes pts stomach to burn    Terazosin Other (See Comments)   Reaction:  Muscle pain       Medication List        Accurate as of 10/12/17  10:23 AM. Always use your most recent med list.          ALPRAZolam 0.5 MG tablet Commonly known as:  XANAX Take 0.5 mg by mouth. Take one tablet nightly as needed for anxiety   amLODipine 2.5 MG tablet Commonly known as:  NORVASC Take 2.5 mg by mouth. Take one tablet daily for blood pressure   ELIQUIS 5 MG Tabs tablet Generic drug:  apixaban Take 5 mg by mouth 2 (two) times daily.   esomeprazole 40 MG capsule Commonly known as:  NEXIUM Take 40 mg by mouth daily before breakfast.   fexofenadine 180 MG tablet Commonly known as:  ALLEGRA Take 180 mg by mouth daily as needed for  allergies.   fluticasone 50 MCG/ACT nasal spray Commonly known as:  FLONASE Place 1 spray into both nostrils. Daily as needed for rhinitis   furosemide 20 MG tablet Commonly known as:  LASIX Take 20 mg by mouth. Take one tablet two times daily   gabapentin 100 MG capsule Commonly known as:  NEURONTIN Take 100 mg by mouth. Take one capsule three times daily   HYDROcodone-acetaminophen 5-325 MG tablet Commonly known as:  NORCO/VICODIN Take 1 tablet by mouth every 8 (eight) hours as needed. Stop 10/26/17   magnesium hydroxide 400 MG/5ML suspension Commonly known as:  MILK OF MAGNESIA Take by mouth. Take 30 ml by mouth daily as needed   multivitamin with minerals tablet Take 1 tablet by mouth daily.   PRESERVISION AREDS PO Take by mouth. Take one tablet twice daily as supplement for eyes   ondansetron 4 MG disintegrating tablet Commonly known as:  ZOFRAN-ODT Take 4 mg by mouth. Take one tablet every 8 hours as needed for nausea   polyethylene glycol packet Commonly known as:  MIRALAX / GLYCOLAX Take 17 g by mouth daily as needed.   PROAIR HFA 108 (90 Base) MCG/ACT inhaler Generic drug:  albuterol Inhale into the lungs. Inhale 2 puff every 6 hours as needed for wheezing   SENNA S PO Take by mouth. Take one tablet nightly for constipation   simethicone 80 MG chewable tablet Commonly known as:  MYLICON Chew 80 mg by mouth. Chew one tablet before meals and at bedtime   sodium chloride 1 g tablet Take 1 g by mouth. Take one tablet two times daily as supplement   SYSTANE OP Place 1 drop into both eyes. Place one drop in both eyes as needed   traMADol 50 MG tablet Commonly known as:  ULTRAM Take 50 mg by mouth. Take one tablet every 8 hours as needed for moderate pain for 14 days. Stop 10/25/17   valsartan 320 MG tablet Commonly known as:  DIOVAN Take 320 mg by mouth daily before breakfast.   vitamin B-12 250 MCG tablet Commonly known as:  CYANOCOBALAMIN Take 250  mcg by mouth. Take one tablet daily as B-12 supplment       No orders of the defined types were placed in this encounter.   Immunization History  Administered Date(s) Administered  . Pneumococcal Conjugate-13 12/16/2014  . Pneumococcal Polysaccharide-23 07/11/2013  . Td 02/06/2009    Social History   Tobacco Use  . Smoking status: Never Smoker  . Smokeless tobacco: Never Used  Substance Use Topics  . Alcohol use: No    Family history is   Family History  Problem Relation Age of Onset  . Heart Problems Unknown        died at 55  . Aneurysm Brother   .  Heart Problems Brother       Review of Systems  DATA OBTAINED: from patient, nurse GENERAL:  no fevers, fatigue, appetite changes SKIN: No itching, or rash EYES: No eye pain, redness, discharge EARS: No earache, tinnitus, change in hearing NOSE: No congestion, drainage or bleeding  MOUTH/THROAT: No mouth or tooth pain, No sore throat RESPIRATORY: No cough, wheezing, SOB CARDIAC: No chest pain, palpitations, lower extremity edema  GI: No abdominal pain, No N/V/D or constipation, No heartburn or reflux  GU: No dysuria, frequency or urgency, or incontinence  MUSCULOSKELETAL: No unrelieved bone/joint pain NEUROLOGIC: No headache, dizziness or focal weakness PSYCHIATRIC: No c/o anxiety or sadness   Vitals:   10/12/17 0911  BP: 128/80  Pulse: 83  Resp: 20  Temp: (!) 97.2 F (36.2 C)    SpO2 Readings from Last 1 Encounters:  07/01/17 97%   Body mass index is 28.95 kg/m.     Physical Exam  GENERAL APPEARANCE: Alert, conversant,  No acute distress.  SKIN: No diaphoresis rash HEAD: Normocephalic, atraumatic  EYES: Conjunctiva/lids clear. Pupils round, reactive. EOMs intact.  EARS: External exam WNL, canals clear. Hearing grossly normal.  NOSE: No deformity or discharge.  MOUTH/THROAT: Lips w/o lesions  RESPIRATORY: Breathing is even, unlabored. Lung sounds are clear   CARDIOVASCULAR: Heart RRR no  murmurs, rubs or gallops. No peripheral edema.   GASTROINTESTINAL: Abdomen is soft, non-tender, not distended w/ normal bowel sounds. GENITOURINARY: Bladder non tender, not distended  MUSCULOSKELETAL: No abnormal joints or musculature NEUROLOGIC:  Cranial nerves 2-12 grossly intact. Moves all extremities  PSYCHIATRIC: Mood and affect appropriate to situation, no behavioral issues  Patient Active Problem List   Diagnosis Date Noted  . History of bradycardia 07/01/2017  . Abdominal pain 04/30/2017  . Constipation 04/30/2017  . Klebsiella cystitis 04/30/2017  . Hypokalemia 04/30/2017  . Chronic back pain   . Osteoporosis   . Allergy history unknown   . Pacemaker   . Hearing difficulty   . Chest pain 04/07/2016  . Atrial flutter (HCC) 04/07/2016  . Chronic anticoagulation 04/07/2016  . Hypertension 03/01/2013  . Hypercalcemia 03/01/2013  . S/P placement of cardiac pacemaker 02/20/2013  . Bradycardia 02/18/2013  . Hyponatremia 02/18/2013  . Chest tightness or pressure 02/18/2013  . Heart block AV third degree (HCC) 02/18/2013  . Depression   . Vertigo   . GERD (gastroesophageal reflux disease)   . Arrhythmia   . Anxiety   . Complete heart block (HCC) 02/13/2013      Labs reviewed: Basic Metabolic Panel:    Component Value Date/Time   NA 131 (A) 10/10/2017   K 4.1 10/10/2017   CL 90 (L) 12/02/2016 0511   CO2 25 12/02/2016 0232   GLUCOSE 78 12/02/2016 0511   BUN 24 (A) 10/10/2017   CREATININE 0.8 10/10/2017   CREATININE 0.50 12/02/2016 0511   CREATININE 0.89 03/01/2013 1147   CALCIUM 8.7 (L) 12/02/2016 0232   PROT 6.2 (L) 12/02/2016 0232   ALBUMIN 4.2 12/02/2016 0232   AST 19 10/10/2017   ALT 20 10/10/2017   ALKPHOS 93 10/06/2017   BILITOT 1.1 12/02/2016 0232   GFRNONAA >60 12/02/2016 0232   GFRAA >60 12/02/2016 0232    Recent Labs    12/02/16 0232 12/02/16 0511  06/08/17 06/13/17 06/20/17 10/10/17  NA 121* 126*   < > 131* 134* 139 131*  K 3.1* 3.0*   < >  4.5 4.2  --  4.1  CL 86* 90*  --   --   --   --   --  CO2 25  --   --   --   --   --   --   GLUCOSE 107* 78  --   --   --   --   --   BUN 14 13   < > 23* 14  --  24*  CREATININE 0.69 0.50   < > 0.8 0.9  --  0.8  CALCIUM 8.7*  --   --   --   --   --   --    < > = values in this interval not displayed.   Liver Function Tests: Recent Labs    12/02/16 0232 04/25/17 06/08/17 10/06/17 10/10/17  AST 20 25 21   --  19  ALT 14 19 20   --  20  ALKPHOS 92 95 77 93  --   BILITOT 1.1  --   --   --   --   PROT 6.2*  --   --   --   --   ALBUMIN 4.2  --   --   --   --    Recent Labs    12/02/16 0232  LIPASE 33   No results for input(s): AMMONIA in the last 8760 hours. CBC: Recent Labs    12/02/16 0232  06/06/17 06/08/17 10/10/17  WBC 7.9   < > 9.6 7.4 7.8  NEUTROABS 5.9  --   --   --   --   HGB 14.8   < > 13.5 12.4 11.4*  HCT 40.6   < > 39 36 34*  MCV 89.2  --   --   --   --   PLT 259   < > 171 155 297   < > = values in this interval not displayed.   Lipid Recent Labs    02/03/17  CHOL 162  HDL 66  LDLCALC 86  TRIG 52    Cardiac Enzymes: No results for input(s): CKTOTAL, CKMB, CKMBINDEX, TROPONINI in the last 8760 hours. BNP: No results for input(s): BNP in the last 8760 hours. No results found for: MICROALBUR No results found for: HGBA1C TSH- 0.863 No results found for: VITAMINB12 No results found for: FOLATE No results found for: IRON, TIBC, FERRITIN  Imaging and Procedures obtained prior to SNF admission: Ct Abdomen Pelvis W Contrast  Result Date: 12/02/2016 CLINICAL DATA:  83 year old with diffuse abdominal pain and diarrhea. EXAM: CT ABDOMEN AND PELVIS WITH CONTRAST TECHNIQUE: Multidetector CT imaging of the abdomen and pelvis was performed using the standard protocol following bolus administration of intravenous contrast. CONTRAST:  100 cc Isovue-300 IV COMPARISON:  CT 01/24/2008 FINDINGS: Lower chest: Borderline cardiomegaly. Pacemaker wires partially included.  Moderate hiatal hernia. No pleural fluid or consolidation. Hepatobiliary: No focal hepatic lesion. Clips in the gallbladder fossa postcholecystectomy. No biliary dilatation. Pancreas: No ductal dilatation or inflammation. Spleen: Normal in size without focal abnormality. Adrenals/Urinary Tract: No adrenal nodule. Symmetric renal enhancement and excretion on delayed phase imaging. No hydronephrosis or perinephric edema. Urinary bladder is mildly distended but without wall thickening. Stomach/Bowel: Moderate hiatal hernia. Stomach is otherwise decompressed. No abnormal small bowel distention or inflammation. Colonic diverticulosis is most prominent in the sigmoid colon. No acute diverticulitis. Fluid within the ascending colon with mild wall enhancement. The appendix is not visualized. Vascular/Lymphatic: Abdominal atherosclerosis and tortuosity. No acute vascular abnormality. No adenopathy. Reproductive: Calcified uterine fibroids.  No adnexal mass. Other: No free air, free fluid, or intra-abdominal fluid collection. Musculoskeletal: Prominent Schmorl's node versus mild remote compression  fracture of inferior endplate of L1. Multilevel degenerative change in the lumbar spine. IMPRESSION: 1. Fluid within the ascending colon with wall enhancement, may reflect mild ascending colitis. 2. No additional acute abnormality of the abdomen/pelvis. 3. Chronic findings include hiatal hernia, colonic diverticulosis without diverticulitis, atherosclerosis, and calcified uterine fibroids. Electronically Signed   By: Rubye Oaks M.D.   On: 12/02/2016 04:48     Not all labs, radiology exams or other studies done during hospitalization come through on my EPIC note; however they are reviewed by me.    Assessment and Plan  ACUTE ON CHRONIC CHF/PULMONARY HYPERTENSION/SEVERE TRICUSPID REGURGITATION-treated with IV Lasix with improvement SNF - admitted for OT/PT; continue with valsartan 320 mg by mouth  daily  HYPONATREMIA-patient had a prolonged stay due to hyponatremia; nephrology was consulted; problem was eventually resolved with treatment with sodium tablets. SNF -continue sodium chloride tablets 1 g twice a day  ACUTE URINARY RETENTION-with placement of Foley, then failed voiding trial; SNF - patient to follow-up with urology Dr. Bonnetta Barry for removal of Foley catheter  HYPERTENSION SNF - controlled; continue valsartan 320 mg by mouth daily  GERD SNF-not stated as uncontrolled; continue Nexium 40 mg by mouth daily  VITAMIN B-12 DEFICIENCY SNF -stable; continue the patient with B12 1000 g daily   Time spent greater than 45 minutes;> 50% of time with patient was spent reviewing records, labs, tests and studies, counseling and developing plan of care  Thurston Hole D. [name], MD

## 2017-10-15 LAB — BASIC METABOLIC PANEL
BUN: 28 — AB (ref 4–21)
CREATININE: 0.9 (ref 0.5–1.1)
GLUCOSE: 82
POTASSIUM: 4.1 (ref 3.4–5.3)
SODIUM: 132 — AB (ref 137–147)

## 2017-10-15 LAB — CBC AND DIFFERENTIAL
HCT: 33 — AB (ref 36–46)
Hemoglobin: 10.7 — AB (ref 12.0–16.0)
Platelets: 295 (ref 150–399)
WBC: 9.9

## 2017-10-16 ENCOUNTER — Encounter: Payer: Self-pay | Admitting: Internal Medicine

## 2017-10-16 DIAGNOSIS — I509 Heart failure, unspecified: Secondary | ICD-10-CM | POA: Insufficient documentation

## 2017-10-16 DIAGNOSIS — E538 Deficiency of other specified B group vitamins: Secondary | ICD-10-CM

## 2017-10-16 DIAGNOSIS — I270 Primary pulmonary hypertension: Secondary | ICD-10-CM | POA: Insufficient documentation

## 2017-10-16 DIAGNOSIS — I071 Rheumatic tricuspid insufficiency: Secondary | ICD-10-CM | POA: Insufficient documentation

## 2017-10-16 DIAGNOSIS — R338 Other retention of urine: Secondary | ICD-10-CM | POA: Insufficient documentation

## 2017-10-16 HISTORY — DX: Rheumatic tricuspid insufficiency: I07.1

## 2017-10-16 HISTORY — DX: Deficiency of other specified B group vitamins: E53.8

## 2017-10-18 ENCOUNTER — Other Ambulatory Visit: Payer: Self-pay

## 2017-10-31 ENCOUNTER — Non-Acute Institutional Stay (SKILLED_NURSING_FACILITY): Payer: Medicare Other | Admitting: Internal Medicine

## 2017-10-31 ENCOUNTER — Encounter: Payer: Self-pay | Admitting: Internal Medicine

## 2017-10-31 DIAGNOSIS — E871 Hypo-osmolality and hyponatremia: Secondary | ICD-10-CM

## 2017-10-31 DIAGNOSIS — N3000 Acute cystitis without hematuria: Secondary | ICD-10-CM

## 2017-10-31 DIAGNOSIS — G9341 Metabolic encephalopathy: Secondary | ICD-10-CM

## 2017-10-31 DIAGNOSIS — G629 Polyneuropathy, unspecified: Secondary | ICD-10-CM | POA: Diagnosis not present

## 2017-10-31 DIAGNOSIS — E860 Dehydration: Secondary | ICD-10-CM

## 2017-10-31 DIAGNOSIS — R338 Other retention of urine: Secondary | ICD-10-CM

## 2017-10-31 DIAGNOSIS — E876 Hypokalemia: Secondary | ICD-10-CM | POA: Diagnosis not present

## 2017-10-31 DIAGNOSIS — I4892 Unspecified atrial flutter: Secondary | ICD-10-CM

## 2017-10-31 DIAGNOSIS — I10 Essential (primary) hypertension: Secondary | ICD-10-CM | POA: Diagnosis not present

## 2017-10-31 NOTE — Progress Notes (Signed)
: Provider:  [name]. [name], MD Location:  [facility] Room Number: [phone] Place of Service:  SNF (31)  PCP: Devra Dopp, MD Patient Care Team: Devra Dopp, MD as PCP - General (Family Medicine)  Extended Emergency Contact Information Primary Emergency Contact: Camire,W.H. Address: [address]          [place], [place] [address] [place] Home Phone: [phone] Relation: Brother     Allergies: Influenza vaccines; Aleve [naproxen sodium]; Aspirin; and Terazosin  Chief Complaint  Patient presents with  . Readmit To SNF    following hospitalization 10/26/17 to 10/29/17 metabolic encephalopathy    HPI: Patient is 83 y.o. female with hypertension and atrial flutter who presented to [facility] ED with increased confusion and generalized weakness for several days in the ED the patient was found to have dehydration with a UTI. Patient was admitted to [facility] from 12/12-15 where she was treated for dehydration, UTI and acute metabolic encephalopathy. Dehydration was complicated by hypokalemia and hyponatremia, all of which improved with IV fluids. Patient is admitted to skilled nursing facility for OT/PT. While at skilled nursing facility patient will be followed for hypertension treated with Norvasc and Lasix, atrial flutter treated with Eliquis) polyneuropathy treated with Neurontin.  Past Medical History:  Diagnosis Date  . Allergy history unknown   . Anxiety   . Arrhythmia   . Arthritis    knees and right hip  . Atrial flutter (HCC) 04/07/2016  . Bradycardia    2004  . Chronic anticoagulation 04/07/2016  . Chronic back pain   . Complete heart block (HCC) 02/2013  . Dementia 12/02/2016   Per patient, mild.  . Depression   . GERD (gastroesophageal reflux disease)   . Hard of hearing   . Hearing difficulty   . Heart block AV third degree (HCC) 02/18/2013  . Hypercalcemia 03/01/2013  . Hypertension    . Hypokalemia 04/30/2017  . Hyponatremia 02/18/2013  . Iron deficiency anemia   . Obesity (BMI 30-39.9)   . Osteoporosis   . Pacemaker   . Pulmonary edema 09/29/2017  . S/P placement of cardiac pacemaker 02/20/2013   02/20/23 in setting of complete heart block   . Sepsis (HCC)   . Severe tricuspid regurgitation by prior echocardiogram 10/16/2017  . Shortness of breath 09/29/2017  . Syncope    2005  . Vertigo   . Vitamin B12 deficiency 10/16/2017    Past Surgical History:  Procedure Laterality Date  . APPENDECTOMY    . CHOLECYSTECTOMY    . KNEE ARTHROSCOPY    . PERMANENT PACEMAKER INSERTION N/A 02/19/2013   Procedure: PERMANENT PACEMAKER INSERTION;  Surgeon: Marinus Maw, MD;  Location: [facility] CATH LAB;  Service: Cardiovascular;  Laterality: N/A;    Allergies as of 10/31/2017      Reactions   Influenza Vaccines Rash   Aleve [naproxen Sodium] Other (See Comments)   Pt was advised by her MD not to take this medication.    Aspirin Other (See Comments)   Reaction:  Causes pts stomach to burn    Terazosin Other (See Comments)   Reaction:  Muscle pain       Medication List        Accurate as of 10/31/17 11:59 PM. Always use your most recent med list.          ALPRAZolam 0.5 MG tablet Commonly known as:  XANAX Take 0.5 mg by mouth. Take one  tablet nightly as needed for anxiety   amLODipine 2.5 MG tablet Commonly known as:  NORVASC Take 2.5 mg by mouth. Take one tablet daily for blood pressure   cephALEXin 500 MG capsule Commonly known as:  KEFLEX Take 500 mg by mouth. Take one capsule four times daily for UTI, stop 11/02/17   ELIQUIS 5 MG Tabs tablet Generic drug:  apixaban Take 5 mg by mouth 2 (two) times daily.   esomeprazole 40 MG capsule Commonly known as:  NEXIUM Take 40 mg by mouth daily before breakfast.   fexofenadine 180 MG tablet Commonly known as:  ALLEGRA Take 180 mg by mouth daily as needed for allergies.   fluticasone 50 MCG/ACT nasal  spray Commonly known as:  FLONASE Place 1 spray into both nostrils. Daily as needed for rhinitis   furosemide 20 MG tablet Commonly known as:  LASIX Take 20 mg by mouth. Take one tablet two times daily   gabapentin 100 MG capsule Commonly known as:  NEURONTIN Take 100 mg by mouth. Take one capsule three times daily   HYDROcodone-acetaminophen 5-325 MG tablet Commonly known as:  NORCO/VICODIN Take 1 tablet by mouth every 8 (eight) hours as needed. Stop 11/12/17   magnesium oxide 400 MG tablet Commonly known as:  MAG-OX Take 400 mg by mouth daily.   multivitamin with minerals tablet Take 1 tablet by mouth daily.   PRESERVISION AREDS PO Take by mouth. Take one tablet twice daily as supplement for eyes   ondansetron 4 MG disintegrating tablet Commonly known as:  ZOFRAN-ODT Take 4 mg by mouth. Take one tablet every 8 hours as needed for nausea   polyethylene glycol packet Commonly known as:  MIRALAX / GLYCOLAX Take 17 g by mouth daily as needed.   PROAIR HFA 108 (90 Base) MCG/ACT inhaler Generic drug:  albuterol Inhale into the lungs. Inhale 2 puff every 6 hours as needed for wheezing   SENNA S PO Take by mouth. Take one tablet nightly for constipation   simethicone 80 MG chewable tablet Commonly known as:  MYLICON Chew 80 mg by mouth. Chew one tablet before meals and at bedtime   sodium chloride 1 g tablet Take 1 g by mouth. Take one tablet two times daily as supplement   SYSTANE OP Place 1 drop into both eyes. Place one drop in both eyes as needed   traMADol 50 MG tablet Commonly known as:  ULTRAM Take 50 mg by mouth. Take one tablet every 8 hours as needed for moderate pain for 14 days. Stop 11/12/17   valsartan 320 MG tablet Commonly known as:  DIOVAN Take 320 mg by mouth daily before breakfast.   vitamin B-12 250 MCG tablet Commonly known as:  CYANOCOBALAMIN Take 250 mcg by mouth. Take one tablet daily as B-12 supplment       No orders of the defined  types were placed in this encounter.   Immunization History  Administered Date(s) Administered  . Pneumococcal Conjugate-13 12/16/2014  . Pneumococcal Polysaccharide-23 07/11/2013  . Td 02/06/2009    Social History   Tobacco Use  . Smoking status: Never Smoker  . Smokeless tobacco: Never Used  Substance Use Topics  . Alcohol use: No    Family history is   Family History  Problem Relation Age of Onset  . Heart Problems Unknown        died at 47  . Aneurysm Brother   . Heart Problems Brother       Review of Systems  DATA OBTAINED: from patient, nurse GENERAL:  no fevers, fatigue, appetite changes SKIN: No itching, or rash EYES: No eye pain, redness, discharge EARS: No earache, tinnitus, change in hearing NOSE: No congestion, drainage or bleeding  MOUTH/THROAT: No mouth or tooth pain, No sore throat RESPIRATORY: No cough, wheezing, SOB CARDIAC: No chest pain, palpitations, lower extremity edema  GI: No abdominal pain, No N/V/D or constipation, No heartburn or reflux  GU: No dysuria, frequency or urgency, or incontinence  MUSCULOSKELETAL: No unrelieved bone/joint pain NEUROLOGIC: No headache, dizziness or focal weakness PSYCHIATRIC: No c/o anxiety or sadness   Vitals:   10/31/17 1132  BP: 121/79  Pulse: 94  Resp: 20  Temp: (!) 97.3 F (36.3 C)  SpO2: 98%    SpO2 Readings from Last 1 Encounters:  10/31/17 98%   Body mass index is 27.99 kg/m.     Physical Exam  GENERAL APPEARANCE: Alert, conversant,  No acute distress.  SKIN: No diaphoresis rash HEAD: Normocephalic, atraumatic  EYES: Conjunctiva/lids clear. Pupils round, reactive. EOMs intact.  EARS: External exam WNL, canals clear. Hearing grossly normal.  NOSE: No deformity or discharge.  MOUTH/THROAT: Lips w/o lesions  RESPIRATORY: Breathing is even, unlabored. Lung sounds are clear   CARDIOVASCULAR: Heart RRR no murmurs, rubs or gallops. No peripheral edema.   GASTROINTESTINAL: Abdomen is  soft, non-tender, not distended w/ normal bowel sounds. GENITOURINARY: Bladder non tender, not distended  MUSCULOSKELETAL: No abnormal joints or musculature NEUROLOGIC:  Cranial nerves 2-12 grossly intact. Moves all extremities  PSYCHIATRIC: Mood and affect appropriate to situation, no behavioral issues  Patient Active Problem List   Diagnosis Date Noted  . UTI (urinary tract infection) 11/12/2017  . Dehydration 11/12/2017  . Acute metabolic encephalopathy 11/12/2017  . Polyneuropathy 11/12/2017  . Acute on chronic congestive heart failure (HCC) 10/16/2017  . Pulmonary hypertension, primary (HCC) 10/16/2017  . Severe tricuspid regurgitation by prior echocardiogram 10/16/2017  . Acute urinary retention 10/16/2017  . Vitamin B12 deficiency 10/16/2017  . History of bradycardia 07/01/2017  . Abdominal pain 04/30/2017  . Constipation 04/30/2017  . Klebsiella cystitis 04/30/2017  . Hypokalemia 04/30/2017  . Chronic back pain   . Osteoporosis   . Allergy history unknown   . Pacemaker   . Hearing difficulty   . Chest pain 04/07/2016  . Atrial flutter (HCC) 04/07/2016  . Chronic anticoagulation 04/07/2016  . Hypertension 03/01/2013  . Hypercalcemia 03/01/2013  . S/P placement of cardiac pacemaker 02/20/2013  . Bradycardia 02/18/2013  . Hyponatremia 02/18/2013  . Chest tightness or pressure 02/18/2013  . Heart block AV third degree (HCC) 02/18/2013  . Depression   . Vertigo   . GERD (gastroesophageal reflux disease)   . Arrhythmia   . Anxiety   . Complete heart block (HCC) 02/13/2013      Labs reviewed: Basic Metabolic Panel:    Component Value Date/Time   NA 132 (A) 10/15/2017   K 4.1 10/15/2017   CL 90 (L) 12/02/2016 0511   CO2 25 12/02/2016 0232   GLUCOSE 78 12/02/2016 0511   BUN 28 (A) 10/15/2017   CREATININE 0.9 10/15/2017   CREATININE 0.50 12/02/2016 0511   CREATININE 0.89 03/01/2013 1147   CALCIUM 8.7 (L) 12/02/2016 0232   PROT 6.2 (L) 12/02/2016 0232    ALBUMIN 4.2 12/02/2016 0232   AST 19 10/10/2017   ALT 20 10/10/2017   ALKPHOS 93 10/06/2017   BILITOT 1.1 12/02/2016 0232   GFRNONAA >60 12/02/2016 0232   GFRAA >60 12/02/2016 0232  Recent Labs    12/02/16 0232 12/02/16 0511  06/13/17 06/20/17 10/10/17 10/15/17  NA 121* 126*   < > 134* 139 131* 132*  K 3.1* 3.0*   < > 4.2  --  4.1 4.1  CL 86* 90*  --   --   --   --   --   CO2 25  --   --   --   --   --   --   GLUCOSE 107* 78  --   --   --   --   --   BUN 14 13   < > 14  --  24* 28*  CREATININE 0.69 0.50   < > 0.9  --  0.8 0.9  CALCIUM 8.7*  --   --   --   --   --   --    < > = values in this interval not displayed.   Liver Function Tests: Recent Labs    12/02/16 0232 04/25/17 06/08/17 10/06/17 10/10/17  AST 20 25 21   --  19  ALT 14 19 20   --  20  ALKPHOS 92 95 77 93  --   BILITOT 1.1  --   --   --   --   PROT 6.2*  --   --   --   --   ALBUMIN 4.2  --   --   --   --    Recent Labs    12/02/16 0232  LIPASE 33   No results for input(s): AMMONIA in the last 8760 hours. CBC: Recent Labs    12/02/16 0232  06/08/17 10/10/17 10/15/17  WBC 7.9   < > 7.4 7.8 9.9  NEUTROABS 5.9  --   --   --   --   HGB 14.8   < > 12.4 11.4* 10.7*  HCT 40.6   < > 36 34* 33*  MCV 89.2  --   --   --   --   PLT 259   < > 155 297 295   < > = values in this interval not displayed.   Lipid Recent Labs    02/03/17  CHOL 162  HDL 66  LDLCALC 86  TRIG 52    Cardiac Enzymes: No results for input(s): CKTOTAL, CKMB, CKMBINDEX, TROPONINI in the last 8760 hours. BNP: No results for input(s): BNP in the last 8760 hours. No results found for: MICROALBUR No results found for: HGBA1C TSH - 0.863 No results found for: VITAMINB12 No results found for: FOLATE No results found for: IRON, TIBC, FERRITIN  Imaging and Procedures obtained prior to SNF admission: Ct Abdomen Pelvis W Contrast  Result Date: 12/02/2016 CLINICAL DATA:  83 year old with diffuse abdominal pain and diarrhea. EXAM: CT  ABDOMEN AND PELVIS WITH CONTRAST TECHNIQUE: Multidetector CT imaging of the abdomen and pelvis was performed using the standard protocol following bolus administration of intravenous contrast. CONTRAST:  100 cc Isovue-300 IV COMPARISON:  CT 01/24/2008 FINDINGS: Lower chest: Borderline cardiomegaly. Pacemaker wires partially included. Moderate hiatal hernia. No pleural fluid or consolidation. Hepatobiliary: No focal hepatic lesion. Clips in the gallbladder fossa postcholecystectomy. No biliary dilatation. Pancreas: No ductal dilatation or inflammation. Spleen: Normal in size without focal abnormality. Adrenals/Urinary Tract: No adrenal nodule. Symmetric renal enhancement and excretion on delayed phase imaging. No hydronephrosis or perinephric edema. Urinary bladder is mildly distended but without wall thickening. Stomach/Bowel: Moderate hiatal hernia. Stomach is otherwise decompressed. No abnormal small bowel distention or inflammation. Colonic diverticulosis  is most prominent in the sigmoid colon. No acute diverticulitis. Fluid within the ascending colon with mild wall enhancement. The appendix is not visualized. Vascular/Lymphatic: Abdominal atherosclerosis and tortuosity. No acute vascular abnormality. No adenopathy. Reproductive: Calcified uterine fibroids.  No adnexal mass. Other: No free air, free fluid, or intra-abdominal fluid collection. Musculoskeletal: Prominent Schmorl's node versus mild remote compression fracture of inferior endplate of L1. Multilevel degenerative change in the lumbar spine. IMPRESSION: 1. Fluid within the ascending colon with wall enhancement, may reflect mild ascending colitis. 2. No additional acute abnormality of the abdomen/pelvis. 3. Chronic findings include hiatal hernia, colonic diverticulosis without diverticulitis, atherosclerosis, and calcified uterine fibroids. Electronically Signed   By: Rubye OaksMelanie  Ehinger M.D.   On: 12/02/2016 04:48     Not all labs, radiology exams or  other studies done during hospitalization come through on my EPIC note; however they are reviewed by me.    Assessment and Plan  UTI/urinary retention-patient presented with a Foley; patient was started and continued on Rocephin; urine culture growing out 3 bacteria; patient to have more control with urology SNF - admitted for OT/PT; continue Keflex for 4 more days; continue Foley, voiding trial with urology follow-up.  Dehydration/hyponatremia/hypokalemia/-resolved with IV fluids and repletion SNF - follow-ups BNP; continue sodium chloride 1 g twice a day  Acute metabolic encephalopathy-felt to be secondary to dehydration and UTI; CT negative for acute pathology neurology was consulted-right-sided weakness is chronic neurology recommend continue Eliquis as stroke prophylaxis SNF - continue Eliquis 5 mg by mouth twice a day  Atrial flutter SNF - in normal sinus rhythm; continue Eliquis 5 mg twice a day as prophylaxis  Hypertension SNF - controlled; continue Norvasc 2.5 mg daily, Lasix 20 mg twice a day, and Diovan 320 mg by mouth daily  Polyneuropathy SNF - controlled; continue Neurontin 100 mg by mouth 3 times a day   Time spent greater than 35 minutes;> 50% of time with patient was spent reviewing records, labs, tests and studies, counseling and developing plan of care  Thurston Holenne D. Lyn HollingsheadAlexander, MD

## 2017-11-12 ENCOUNTER — Encounter: Payer: Self-pay | Admitting: Internal Medicine

## 2017-11-12 DIAGNOSIS — G9341 Metabolic encephalopathy: Secondary | ICD-10-CM | POA: Insufficient documentation

## 2017-11-12 DIAGNOSIS — N390 Urinary tract infection, site not specified: Secondary | ICD-10-CM | POA: Insufficient documentation

## 2017-11-12 DIAGNOSIS — G629 Polyneuropathy, unspecified: Secondary | ICD-10-CM | POA: Insufficient documentation

## 2017-11-12 DIAGNOSIS — E860 Dehydration: Secondary | ICD-10-CM | POA: Insufficient documentation

## 2017-11-14 ENCOUNTER — Non-Acute Institutional Stay (SKILLED_NURSING_FACILITY): Payer: Medicare Other | Admitting: Internal Medicine

## 2017-11-14 DIAGNOSIS — I952 Hypotension due to drugs: Secondary | ICD-10-CM | POA: Diagnosis not present

## 2017-11-16 ENCOUNTER — Encounter: Payer: Self-pay | Admitting: Internal Medicine

## 2017-11-16 NOTE — Progress Notes (Signed)
Location:  [facility] Room Number: [phone] Place of Service:  SNF [phone])  Provider: [name]. [name], MD  Devra Dopp, MD  Patient Care Team: Devra Dopp, MD as PCP - General [facility])  Extended Emergency Contact Information Primary Emergency Contact: Hohmann,W.H. Address: [address]          [place], [place] [address] [place] Home Phone: [phone] Relation: Brother    Allergies: Influenza vaccines; Aleve [naproxen sodium]; Aspirin; and Terazosin  Chief Complaint  Patient presents with  . Acute Visit    decrease blood pressure    HPI: Patient is 84 y.o. female who nursing asked me to see because her blood pressures have been running low. Patient's systolic blood pressures running from 87 up to 134. Patient denies any dizziness or lightheadedness shortness of breath.  Past Medical History:  Diagnosis Date  . Allergy history unknown   . Anxiety   . Arrhythmia   . Arthritis    knees and right hip  . Atrial flutter (HCC) 04/07/2016  . Bradycardia    2004  . Chronic anticoagulation 04/07/2016  . Chronic back pain   . Complete heart block (HCC) 02/2013  . Dementia 12/02/2016   Per patient, mild.  . Depression   . GERD (gastroesophageal reflux disease)   . Hard of hearing   . Hearing difficulty   . Heart block AV third degree (HCC) 02/18/2013  . Hypercalcemia 03/01/2013  . Hypertension   . Hypokalemia 04/30/2017  . Hyponatremia 02/18/2013  . Iron deficiency anemia   . Obesity (BMI 30-39.9)   . Osteoporosis   . Pacemaker   . Pulmonary edema 09/29/2017  . S/P placement of cardiac pacemaker 02/20/2013   02/20/23 in setting of complete heart block   . Sepsis (HCC)   . Severe tricuspid regurgitation by prior echocardiogram 10/16/2017  . Shortness of breath 09/29/2017  . Syncope    2005  . Vertigo   . Vitamin B12 deficiency 10/16/2017    Past Surgical History:  Procedure Laterality Date  . APPENDECTOMY    .  CHOLECYSTECTOMY    . KNEE ARTHROSCOPY    . PERMANENT PACEMAKER INSERTION N/A 02/19/2013   Procedure: PERMANENT PACEMAKER INSERTION;  Surgeon: Marinus Maw, MD;  Location: [facility] CATH LAB;  Service: Cardiovascular;  Laterality: N/A;    Allergies as of 11/14/2017      Reactions   Influenza Vaccines Rash   Aleve [naproxen Sodium] Other (See Comments)   Pt was advised by her MD not to take this medication.    Aspirin Other (See Comments)   Reaction:  Causes pts stomach to burn    Terazosin Other (See Comments)   Reaction:  Muscle pain       Medication List        Accurate as of 11/14/17 11:59 PM. Always use your most recent med list.          ALPRAZolam 0.5 MG tablet Commonly known as:  XANAX Take 0.5 mg by mouth. Take one tablet nightly as needed for anxiety   amLODipine 2.5 MG tablet Commonly known as:  NORVASC Take 2.5 mg by mouth. Take one tablet daily for blood pressure   ELIQUIS 5 MG Tabs tablet Generic drug:  apixaban Take 5 mg by mouth 2 (two) times daily.   esomeprazole 40 MG capsule Commonly known as:  NEXIUM Take 40 mg by mouth daily before breakfast.   fexofenadine 180 MG tablet Commonly known as:  ALLEGRA Take 180 mg by mouth daily as needed for allergies.   fluticasone 50 MCG/ACT nasal spray Commonly known as:  FLONASE Place 1 spray into both nostrils. Daily as needed for rhinitis   furosemide 20 MG tablet Commonly known as:  LASIX Take 20 mg by mouth. Take one tablet two times daily   gabapentin 100 MG capsule Commonly known as:  NEURONTIN Take 100 mg by mouth. Take one capsule three times daily   magnesium oxide 400 MG tablet Commonly known as:  MAG-OX Take 400 mg by mouth daily.   multivitamin with minerals tablet Take 1 tablet by mouth daily.   PRESERVISION AREDS PO Take by mouth. Take one tablet twice daily as supplement for eyes   ondansetron 4 MG disintegrating tablet Commonly known as:  ZOFRAN-ODT Take 4 mg by mouth. Take one tablet  every 8 hours as needed for nausea   polyethylene glycol packet Commonly known as:  MIRALAX / GLYCOLAX Take 17 g by mouth daily as needed.   PROAIR HFA 108 (90 Base) MCG/ACT inhaler Generic drug:  albuterol Inhale into the lungs. Inhale 2 puff every 6 hours as needed for wheezing   SENNA S PO Take by mouth. Take one tablet nightly for constipation   simethicone 80 MG chewable tablet Commonly known as:  MYLICON Chew 80 mg by mouth. Chew one tablet before meals and at bedtime   sodium chloride 1 g tablet Take 1 g by mouth. Take one tablet two times daily as supplement   SYSTANE OP Place 1 drop into both eyes. Place one drop in both eyes as needed   valsartan 320 MG tablet Commonly known as:  DIOVAN Take 320 mg by mouth daily before breakfast.   vitamin B-12 250 MCG tablet Commonly known as:  CYANOCOBALAMIN Take 250 mcg by mouth. Take one tablet daily as B-12 supplment       No orders of the defined types were placed in this encounter.   Immunization History  Administered Date(s) Administered  . Pneumococcal Conjugate-13 12/16/2014  . Pneumococcal Polysaccharide-23 07/11/2013  . Td 02/06/2009    Social History   Tobacco Use  . Smoking status: Never Smoker  . Smokeless tobacco: Never Used  Substance Use Topics  . Alcohol use: No    Review of Systems  DATA OBTAINED: from patient, nurse GENERAL:  no fevers, fatigue, appetite changes SKIN: No itching, rash HEENT: No complaint RESPIRATORY: No cough, wheezing, SOB CARDIAC: No chest pain, palpitations, lower extremity edema  GI: No abdominal pain, No N/V/D or constipation, No heartburn or reflux  GU: No dysuria, frequency or urgency, or incontinence  MUSCULOSKELETAL: No unrelieved bone/joint pain NEUROLOGIC: No headache, dizziness  PSYCHIATRIC: No overt anxiety or sadness  Vitals:   11/14/17 1553  BP: 125/76  Pulse: 81  Resp: 19  Temp: (!) 97 F (36.1 C)  SpO2: 98%   Body mass index is 24.98  kg/m. Physical Exam  GENERAL APPEARANCE: Alert, conversant, No acute distress  SKIN: No diaphoresis rash HEENT: Unremarkable RESPIRATORY: Breathing is even, unlabored. Lung sounds are clear   CARDIOVASCULAR: Heart RRR no murmurs, rubs or gallops. No peripheral edema  GASTROINTESTINAL: Abdomen is soft, non-tender, not distended w/ normal bowel sounds.  GENITOURINARY: Bladder non tender, not distended  MUSCULOSKELETAL: No abnormal joints or musculature NEUROLOGIC: Cranial nerves 2-12 grossly intact. Moves all extremities PSYCHIATRIC: Mood and affect appropriate to situation, no behavioral issues  Patient Active Problem List   Diagnosis Date Noted  . UTI (urinary tract  infection) 11/12/2017  . Dehydration 11/12/2017  . Acute metabolic encephalopathy 11/12/2017  . Polyneuropathy 11/12/2017  . Acute on chronic congestive heart failure (HCC) 10/16/2017  . Pulmonary hypertension, primary (HCC) 10/16/2017  . Severe tricuspid regurgitation by prior echocardiogram 10/16/2017  . Acute urinary retention 10/16/2017  . Vitamin B12 deficiency 10/16/2017  . History of bradycardia 07/01/2017  . Abdominal pain 04/30/2017  . Constipation 04/30/2017  . Klebsiella cystitis 04/30/2017  . Hypokalemia 04/30/2017  . Chronic back pain   . Osteoporosis   . Allergy history unknown   . Pacemaker   . Hearing difficulty   . Chest pain 04/07/2016  . Atrial flutter (HCC) 04/07/2016  . Chronic anticoagulation 04/07/2016  . Hypertension 03/01/2013  . Hypercalcemia 03/01/2013  . S/P placement of cardiac pacemaker 02/20/2013  . Bradycardia 02/18/2013  . Hyponatremia 02/18/2013  . Chest tightness or pressure 02/18/2013  . Heart block AV third degree (HCC) 02/18/2013  . Depression   . Vertigo   . GERD (gastroesophageal reflux disease)   . Arrhythmia   . Anxiety   . Complete heart block (HCC) 02/13/2013    CMP     Component Value Date/Time   NA 132 (A) 10/15/2017   K 4.1 10/15/2017   CL 90 (L)  12/02/2016 0511   CO2 25 12/02/2016 0232   GLUCOSE 78 12/02/2016 0511   BUN 28 (A) 10/15/2017   CREATININE 0.9 10/15/2017   CREATININE 0.50 12/02/2016 0511   CREATININE 0.89 03/01/2013 1147   CALCIUM 8.7 (L) 12/02/2016 0232   PROT 6.2 (L) 12/02/2016 0232   ALBUMIN 4.2 12/02/2016 0232   AST 19 10/10/2017   ALT 20 10/10/2017   ALKPHOS 93 10/06/2017   BILITOT 1.1 12/02/2016 0232   GFRNONAA >60 12/02/2016 0232   GFRAA >60 12/02/2016 0232   Recent Labs    12/02/16 0232 12/02/16 0511  06/13/17 06/20/17 10/10/17 10/15/17  NA 121* 126*   < > 134* 139 131* 132*  K 3.1* 3.0*   < > 4.2  --  4.1 4.1  CL 86* 90*  --   --   --   --   --   CO2 25  --   --   --   --   --   --   GLUCOSE 107* 78  --   --   --   --   --   BUN 14 13   < > 14  --  24* 28*  CREATININE 0.69 0.50   < > 0.9  --  0.8 0.9  CALCIUM 8.7*  --   --   --   --   --   --    < > = values in this interval not displayed.   Recent Labs    12/02/16 0232 04/25/17 06/08/17 10/06/17 10/10/17  AST 20 25 21   --  19  ALT 14 19 20   --  20  ALKPHOS 92 95 77 93  --   BILITOT 1.1  --   --   --   --   PROT 6.2*  --   --   --   --   ALBUMIN 4.2  --   --   --   --    Recent Labs    12/02/16 0232  06/08/17 10/10/17 10/15/17  WBC 7.9   < > 7.4 7.8 9.9  NEUTROABS 5.9  --   --   --   --   HGB 14.8   < > 12.4 11.4*  10.7*  HCT 40.6   < > 36 34* 33*  MCV 89.2  --   --   --   --   PLT 259   < > 155 297 295   < > = values in this interval not displayed.   Recent Labs    02/03/17  CHOL 162  LDLCALC 86  TRIG 52   No results found for: MICROALBUR TSH-0.863 No results found for: HGBA1C Lab Results  Component Value Date   CHOL 162 02/03/2017   HDL 66 02/03/2017   LDLCALC 86 02/03/2017   TRIG 52 02/03/2017   CHOLHDL 2.1 02/18/2013    Significant Diagnostic Results in last 30 days:  No results found.  Assessment and Plan  Hypotension-we'll start with DC Norvasc 2.5 mg by mouth daily; patient continues on Diovan 320 mg by  mouth daily-this week and decrease if needed; we'll monitor results of change    Thurston Holenne D. Lyn HollingsheadAlexander, MD

## 2017-11-18 ENCOUNTER — Encounter: Payer: Self-pay | Admitting: Internal Medicine

## 2017-11-18 ENCOUNTER — Non-Acute Institutional Stay (SKILLED_NURSING_FACILITY): Payer: Medicare Other | Admitting: Internal Medicine

## 2017-11-18 DIAGNOSIS — R29898 Other symptoms and signs involving the musculoskeletal system: Secondary | ICD-10-CM | POA: Diagnosis not present

## 2017-11-18 DIAGNOSIS — R471 Dysarthria and anarthria: Secondary | ICD-10-CM

## 2017-11-18 DIAGNOSIS — R6889 Other general symptoms and signs: Secondary | ICD-10-CM

## 2017-11-19 ENCOUNTER — Encounter: Payer: Self-pay | Admitting: Internal Medicine

## 2017-11-19 NOTE — Progress Notes (Signed)
Location:  [facility] Room Number: [phone] Place of Service:  SNF [phone]) Provider: Margit Hanks MD  Patient Care Team: Devra Dopp, MD as PCP - General (Family Medicine)  Extended Emergency Contact Information Primary Emergency Contact: Abruzzese,W.H. Address: [address]          [place], [place] [address] [place] Home Phone: [phone] Relation: Brother    Allergies: Influenza vaccines; Aleve [naproxen sodium]; Aspirin; and Terazosin  Chief Complaint  Patient presents with  . Acute Visit    ? stroke-like symptoms    HPI: Patient is 84 y.o. female who is being seen at the request of physical therapy. This admission on 10/11/17 patient has been very different than she had discharge from prior admission on 07/01/2017. Physical therapy reports that patient prior was minimal assist, anchor walk 75 feet. This admission patient has no truncal stability, has extremity weakness on both sides left greater than the right, and cannot walk at all because she crumples over. In addition patient cannot feed herself because she has upper extremity ataxia in addition to weakness. Speech therapy is noted that patient's speech is dysarthric she has difficulty with volume control and speech is at times slurred. They tell me she is very frustrated because she is a very Chief Executive Officer and her lack of progress is not due to lack of effort.  Past Medical History:  Diagnosis Date  . Allergy history unknown   . Anxiety   . Arrhythmia   . Arthritis    knees and right hip  . Atrial flutter (HCC) 04/07/2016  . Bradycardia    2004  . Chronic anticoagulation 04/07/2016  . Chronic back pain   . Complete heart block (HCC) 02/2013  . Dementia 12/02/2016   Per patient, mild.  . Depression   . GERD (gastroesophageal reflux disease)   . Hard of hearing   . Hearing difficulty   . Heart block AV third degree (HCC) 02/18/2013  . Hypercalcemia 03/01/2013  . Hypertension    . Hypokalemia 04/30/2017  . Hyponatremia 02/18/2013  . Iron deficiency anemia   . Obesity (BMI 30-39.9)   . Osteoporosis   . Pacemaker   . Pulmonary edema 09/29/2017  . S/P placement of cardiac pacemaker 02/20/2013   02/20/23 in setting of complete heart block   . Sepsis (HCC)   . Severe tricuspid regurgitation by prior echocardiogram 10/16/2017  . Shortness of breath 09/29/2017  . Syncope    2005  . Vertigo   . Vitamin B12 deficiency 10/16/2017    Past Surgical History:  Procedure Laterality Date  . APPENDECTOMY    . CHOLECYSTECTOMY    . KNEE ARTHROSCOPY    . PERMANENT PACEMAKER INSERTION N/A 02/19/2013   Procedure: PERMANENT PACEMAKER INSERTION;  Surgeon: Marinus Maw, MD;  Location: [facility] CATH LAB;  Service: Cardiovascular;  Laterality: N/A;    Allergies as of 11/18/2017      Reactions   Influenza Vaccines Rash   Aleve [naproxen Sodium] Other (See Comments)   Pt was advised by her MD not to take this medication.    Aspirin Other (See Comments)   Reaction:  Causes pts stomach to burn    Terazosin Other (See Comments)   Reaction:  Muscle pain       Medication List        Accurate as of 11/18/17 11:59 PM. Always use your most recent med list.          ALPRAZolam  0.5 MG tablet Commonly known as:  XANAX Take 0.5 mg by mouth. Take one tablet nightly as needed for anxiety   amLODipine 2.5 MG tablet Commonly known as:  NORVASC Take 2.5 mg by mouth. Take one tablet daily for blood pressure   ELIQUIS 5 MG Tabs tablet Generic drug:  apixaban Take 5 mg by mouth 2 (two) times daily.   esomeprazole 40 MG capsule Commonly known as:  NEXIUM Take 40 mg by mouth daily before breakfast.   fexofenadine 180 MG tablet Commonly known as:  ALLEGRA Take 180 mg by mouth daily as needed for allergies.   fluticasone 50 MCG/ACT nasal spray Commonly known as:  FLONASE Place 1 spray into both nostrils. Daily as needed for rhinitis   furosemide 20 MG tablet Commonly known as:   LASIX Take 20 mg by mouth. Take one tablet two times daily   gabapentin 100 MG capsule Commonly known as:  NEURONTIN Take 100 mg by mouth. Take one capsule three times daily   magnesium oxide 400 MG tablet Commonly known as:  MAG-OX Take 400 mg by mouth daily.   multivitamin with minerals tablet Take 1 tablet by mouth daily.   PRESERVISION AREDS PO Take by mouth. Take one tablet twice daily as supplement for eyes   ondansetron 4 MG disintegrating tablet Commonly known as:  ZOFRAN-ODT Take 4 mg by mouth. Take one tablet every 8 hours as needed for nausea   polyethylene glycol packet Commonly known as:  MIRALAX / GLYCOLAX Take 17 g by mouth daily as needed.   PROAIR HFA 108 (90 Base) MCG/ACT inhaler Generic drug:  albuterol Inhale into the lungs. Inhale 2 puff every 6 hours as needed for wheezing   SENNA S PO Take by mouth. Take one tablet nightly for constipation   simethicone 80 MG chewable tablet Commonly known as:  MYLICON Chew 80 mg by mouth. Chew one tablet before meals and at bedtime   sodium chloride 1 g tablet Take 1 g by mouth. Take one tablet two times daily as supplement   SYSTANE OP Place 1 drop into both eyes. Place one drop in both eyes as needed   valsartan 320 MG tablet Commonly known as:  DIOVAN Take 320 mg by mouth daily before breakfast.   vitamin B-12 250 MCG tablet Commonly known as:  CYANOCOBALAMIN Take 250 mcg by mouth. Take one tablet daily as B-12 supplment       No orders of the defined types were placed in this encounter.   Immunization History  Administered Date(s) Administered  . Pneumococcal Conjugate-13 12/16/2014  . Pneumococcal Polysaccharide-23 07/11/2013  . Td 02/06/2009    Social History   Tobacco Use  . Smoking status: Never Smoker  . Smokeless tobacco: Never Used  Substance Use Topics  . Alcohol use: No    Review of Systems  DATA OBTAINED: from therapy, nursing GENERAL:  no fevers, fatigue, appetite  changes SKIN: No itching, rash HEENT: No complaint RESPIRATORY: No cough, wheezing, SOB CARDIAC: No chest pain, palpitations, lower extremity edema  GI: No abdominal pain, No N/V/D or constipation, No heartburn or reflux  GU: No dysuria, frequency or urgency, or incontinence  MUSCULOSKELETAL: No unrelieved bone/joint pain NEUROLOGIC: As per history present illness PSYCHIATRIC: No overt anxiety or sadness  Vitals:   11/18/17 1350  Pulse: 82  Resp: 20   Body mass index is 24.98 kg/m. Physical Exam  GENERAL APPEARANCE: Alert, conversant, No acute distress  SKIN: No diaphoresis rash HEENT: Unremarkable RESPIRATORY:  Breathing is even, unlabored. Lung sounds are clear   CARDIOVASCULAR: Heart RRR no murmurs, rubs or gallops. No peripheral edema  GASTROINTESTINAL: Abdomen is soft, non-tender, not distended w/ normal bowel sounds.  GENITOURINARY: Bladder non tender, not distended  MUSCULOSKELETAL: No abnormal joints or musculature NEUROLOGIC: Cranial nerves 2-12 grossly intact.; Speech is loud, is not slurred today; bilateral upper and lower extremity weakness left greater than right PSYCHIATRIC: Mood and affect appropriate to situation, no behavioral issues  Patient Active Problem List   Diagnosis Date Noted  . UTI (urinary tract infection) 11/12/2017  . Dehydration 11/12/2017  . Acute metabolic encephalopathy 11/12/2017  . Polyneuropathy 11/12/2017  . Acute on chronic congestive heart failure (HCC) 10/16/2017  . Pulmonary hypertension, primary (HCC) 10/16/2017  . Severe tricuspid regurgitation by prior echocardiogram 10/16/2017  . Acute urinary retention 10/16/2017  . Vitamin B12 deficiency 10/16/2017  . History of bradycardia 07/01/2017  . Abdominal pain 04/30/2017  . Constipation 04/30/2017  . Klebsiella cystitis 04/30/2017  . Hypokalemia 04/30/2017  . Chronic back pain   . Osteoporosis   . Allergy history unknown   . Pacemaker   . Hearing difficulty   . Chest pain  04/07/2016  . Atrial flutter (HCC) 04/07/2016  . Chronic anticoagulation 04/07/2016  . Hypertension 03/01/2013  . Hypercalcemia 03/01/2013  . S/P placement of cardiac pacemaker 02/20/2013  . Bradycardia 02/18/2013  . Hyponatremia 02/18/2013  . Chest tightness or pressure 02/18/2013  . Heart block AV third degree (HCC) 02/18/2013  . Depression   . Vertigo   . GERD (gastroesophageal reflux disease)   . Arrhythmia   . Anxiety   . Complete heart block (HCC) 02/13/2013    CMP     Component Value Date/Time   NA 132 (A) 10/15/2017   K 4.1 10/15/2017   CL 90 (L) 12/02/2016 0511   CO2 25 12/02/2016 0232   GLUCOSE 78 12/02/2016 0511   BUN 28 (A) 10/15/2017   CREATININE 0.9 10/15/2017   CREATININE 0.50 12/02/2016 0511   CREATININE 0.89 03/01/2013 1147   CALCIUM 8.7 (L) 12/02/2016 0232   PROT 6.2 (L) 12/02/2016 0232   ALBUMIN 4.2 12/02/2016 0232   AST 19 10/10/2017   ALT 20 10/10/2017   ALKPHOS 93 10/06/2017   BILITOT 1.1 12/02/2016 0232   GFRNONAA >60 12/02/2016 0232   GFRAA >60 12/02/2016 0232   Recent Labs    12/02/16 0232 12/02/16 0511  06/13/17 06/20/17 10/10/17 10/15/17  NA 121* 126*   < > 134* 139 131* 132*  K 3.1* 3.0*   < > 4.2  --  4.1 4.1  CL 86* 90*  --   --   --   --   --   CO2 25  --   --   --   --   --   --   GLUCOSE 107* 78  --   --   --   --   --   BUN 14 13   < > 14  --  24* 28*  CREATININE 0.69 0.50   < > 0.9  --  0.8 0.9  CALCIUM 8.7*  --   --   --   --   --   --    < > = values in this interval not displayed.   Recent Labs    12/02/16 0232 04/25/17 06/08/17 10/06/17 10/10/17  AST 20 25 21   --  19  ALT 14 19 20   --  20  ALKPHOS 92 95 77 93  --  BILITOT 1.1  --   --   --   --   PROT 6.2*  --   --   --   --   ALBUMIN 4.2  --   --   --   --    Recent Labs    12/02/16 0232  06/08/17 10/10/17 10/15/17  WBC 7.9   < > 7.4 7.8 9.9  NEUTROABS 5.9  --   --   --   --   HGB 14.8   < > 12.4 11.4* 10.7*  HCT 40.6   < > 36 34* 33*  MCV 89.2  --   --    --   --   PLT 259   < > 155 297 295   < > = values in this interval not displayed.   Recent Labs    02/03/17  CHOL 162  LDLCALC 86  TRIG 52   No results found for: MICROALBUR TSH- 0.863 No results found for: HGBA1C Lab Results  Component Value Date   CHOL 162 02/03/2017   HDL 66 02/03/2017   LDLCALC 86 02/03/2017   TRIG 52 02/03/2017   CHOLHDL 2.1 02/18/2013    Significant Diagnostic Results in last 30 days:  No results found.  Assessment and Plan  Truncal instability/extremity weakness left greater than right/dysarthria- concern for undiagnosed stroke or other neurologic process; we'll need to send patient to neurology as outpatient; we'll work to get patient in ASAP    Merrilee SeashoreAnne Alexander, MD

## 2017-11-20 ENCOUNTER — Encounter: Payer: Self-pay | Admitting: Internal Medicine

## 2017-11-21 ENCOUNTER — Non-Acute Institutional Stay (SKILLED_NURSING_FACILITY): Payer: Medicare Other | Admitting: Internal Medicine

## 2017-11-21 ENCOUNTER — Encounter: Payer: Self-pay | Admitting: Internal Medicine

## 2017-11-21 DIAGNOSIS — Z9189 Other specified personal risk factors, not elsewhere classified: Secondary | ICD-10-CM | POA: Diagnosis not present

## 2017-11-21 NOTE — Progress Notes (Signed)
Location:  [facility] Room Number: 107P Place of Service:  SNF (31)  Tonya Maddox. Tonya Hollingshead, MD  Patient Care Team: Devra Dopp, MD as PCP - General [facility] Medicine)  Extended Emergency Contact Information Primary Emergency Contact: Lehn,W.H. Address: [address]          [place]a Madd[place], [place] [address] [place] Home Phone: [phone] Relation: Brother    Allergies: Influenza vaccines; Aleve [naproxen sodium]; Aspirin; and Terazosin  Chief Complaint  Patient presents with  . Acute Visit    Recheck trouble swallowing    HPI: Patient is 84 y.o. female who nursing asked me to see today because she is complaining of being short of breath. O2 saturation on 2 L of the bedside is 98%. From patient or ellicit a complaint of its hard to swallow. She admits is more like her mouth feels dry. She does say that she is short of breath but apparently she is telling me this she looks completely comfortable respiratory rate is not increased and she is getting O2 in her nose.   Past Medical History:  Diagnosis Date  . Allergy history unknown   . Anxiety   . Arrhythmia   . Arthritis    knees and right hip  . Atrial flutter (HCC) 04/07/2016  . Bradycardia    2004  . Chronic anticoagulation 04/07/2016  . Chronic back pain   . Complete heart block (HCC) 02/2013  . Dementia 12/02/2016   Per patient, mild.  . Depression   . GERD (gastroesophageal reflux disease)   . Hard of hearing   . Hearing difficulty   . Heart block AV third degree (HCC) 02/18/2013  . Hypercalcemia 03/01/2013  . Hypertension   . Hypokalemia 04/30/2017  . Hyponatremia 02/18/2013  . Iron deficiency anemia   . Obesity (BMI 30-39.9)   . Osteoporosis   . Pacemaker   . Pulmonary edema 09/29/2017  . S/P placement of cardiac pacemaker 02/20/2013   02/20/23 in setting of complete heart block   . Sepsis (HCC)   . Severe tricuspid regurgitation by prior echocardiogram 10/16/2017  .  Shortness of breath 09/29/2017  . Syncope    2005  . Vertigo   . Vitamin B12 deficiency 10/16/2017    Past Surgical History:  Procedure Laterality Date  . APPENDECTOMY    . CHOLECYSTECTOMY    . KNEE ARTHROSCOPY    . PERMANENT PACEMAKER INSERTION N/A 02/19/2013   Procedure: PERMANENT PACEMAKER INSERTION;  Surgeon: Marinus Maw, MD;  Location: [facility] CATH LAB;  Service: Cardiovascular;  Laterality: N/A;    Allergies as of 11/21/2017      Reactions   Influenza Vaccines Rash   Aleve [naproxen Sodium] Other (See Comments)   Pt was advised by her MD not to take this medication.    Aspirin Other (See Comments)   Reaction:  Causes pts stomach to burn    Terazosin Other (See Comments)   Reaction:  Muscle pain       Medication List        Accurate as of 11/21/17  4:04 PM. Always use your most recent med list.          ALPRAZolam 0.5 MG tablet Commonly known as:  XANAX Take 0.5 mg by mouth. Take one tablet nightly as needed for anxiety   amLODipine 2.5 MG tablet Commonly known as:  NORVASC Take 2.5 mg by mouth. Take one tablet daily for blood pressure   ELIQUIS 5 MG Tabs  tablet Generic drug:  apixaban Take 5 mg by mouth 2 (two) times daily.   esomeprazole 40 MG capsule Commonly known as:  NEXIUM Take 40 mg by mouth daily before breakfast.   fexofenadine 180 MG tablet Commonly known as:  ALLEGRA Take 180 mg by mouth daily as needed for allergies.   fluticasone 50 MCG/ACT nasal spray Commonly known as:  FLONASE Place 1 spray into both nostrils. Daily as needed for rhinitis   furosemide 20 MG tablet Commonly known as:  LASIX Take 20 mg by mouth. Take one tablet two times daily   gabapentin 100 MG capsule Commonly known as:  NEURONTIN Take 100 mg by mouth. Take one capsule three times daily   magnesium oxide 400 MG tablet Commonly known as:  MAG-OX Take 400 mg by mouth daily.   multivitamin with minerals tablet Take 1 tablet by mouth daily.   PRESERVISION AREDS  PO Take by mouth. Take one tablet twice daily as supplement for eyes   ondansetron 4 MG disintegrating tablet Commonly known as:  ZOFRAN-ODT Take 4 mg by mouth. Take one tablet every 8 hours as needed for nausea   polyethylene glycol packet Commonly known as:  MIRALAX / GLYCOLAX Take 17 g by mouth daily as needed.   PROAIR HFA 108 (90 Base) MCG/ACT inhaler Generic drug:  albuterol Inhale into the lungs. Inhale 2 puff every 6 hours as needed for wheezing   SENNA S PO Take by mouth. Take one tablet nightly for constipation   simethicone 80 MG chewable tablet Commonly known as:  MYLICON Chew 80 mg by mouth. Chew one tablet before meals and at bedtime   sodium chloride 1 g tablet Take 1 g by mouth. Take one tablet two times daily as supplement   SYSTANE OP Place 1 drop into both eyes. Place one drop in both eyes as needed   valsartan 320 MG tablet Commonly known as:  DIOVAN Take 320 mg by mouth daily before breakfast.   vitamin B-12 250 MCG tablet Commonly known as:  CYANOCOBALAMIN Take 250 mcg by mouth. Take one tablet daily as B-12 supplment       No orders of the defined types were placed in this encounter.   Immunization History  Administered Date(s) Administered  . Pneumococcal Conjugate-13 12/16/2014  . Pneumococcal Polysaccharide-23 07/11/2013  . Td 02/06/2009    Social History   Tobacco Use  . Smoking status: Never Smoker  . Smokeless tobacco: Never Used  Substance Use Topics  . Alcohol use: No    Review of Systems  DATA OBTAINED: from patient, nurse GENERAL:  no fevers, fatigue, appetite changes SKIN: No itching, rash HEENT: Hard to swallow RESPIRATORY: No cough, wheezing,c/o  SOB CARDIAC: No chest pain, palpitations, lower extremity edema  GI: No abdominal pain, No N/V/D or constipation, No heartburn or reflux  GU: No dysuria, frequency or urgency, or incontinence  MUSCULOSKELETAL: No unrelieved bone/joint pain NEUROLOGIC: No headache,  dizziness  PSYCHIATRIC: No overt anxiety or sadness  Vitals:   11/21/17 1602  BP: 130/70  Pulse: 79  Resp: 18  Temp: 98.2 F (36.8 C)  SpO2: 98%   Body mass index is 24.98 kg/m. Physical Exam  GENERAL APPEARANCE: Alert, conversant, No acute distress; in general patient appears to be in a slow decline  SKIN: No diaphoresis rash HEENT: Unremarkable RESPIRATORY: Breathing is even, unlabored. Lung sounds are clear; bedside O2 saturation on 2 L is 98%   CARDIOVASCULAR: Heart RRR no murmurs, rubs or gallops. No  peripheral edema  GASTROINTESTINAL: Abdomen is soft, non-tender, not distended w/ normal bowel sounds.  GENITOURINARY: Bladder non tender, not distended  MUSCULOSKELETAL: No abnormal joints or musculature NEUROLOGIC: Cranial nerves 2-12 grossly intact. Moves all extremities PSYCHIATRIC: Mood and affect appropriate to situation, no behavioral issues  Patient Active Problem List   Diagnosis Date Noted  . UTI (urinary tract infection) 11/12/2017  . Dehydration 11/12/2017  . Acute metabolic encephalopathy 11/12/2017  . Polyneuropathy 11/12/2017  . Acute on chronic congestive heart failure (HCC) 10/16/2017  . Pulmonary hypertension, primary (HCC) 10/16/2017  . Severe tricuspid regurgitation by prior echocardiogram 10/16/2017  . Acute urinary retention 10/16/2017  . Vitamin B12 deficiency 10/16/2017  . History of bradycardia 07/01/2017  . Abdominal pain 04/30/2017  . Constipation 04/30/2017  . Klebsiella cystitis 04/30/2017  . Hypokalemia 04/30/2017  . Chronic back pain   . Osteoporosis   . Allergy history unknown   . Pacemaker   . Hearing difficulty   . Chest pain 04/07/2016  . Atrial flutter (HCC) 04/07/2016  . Chronic anticoagulation 04/07/2016  . Hypertension 03/01/2013  . Hypercalcemia 03/01/2013  . S/P placement of cardiac pacemaker 02/20/2013  . Bradycardia 02/18/2013  . Hyponatremia 02/18/2013  . Chest tightness or pressure 02/18/2013  . Heart block AV  third degree (HCC) 02/18/2013  . Depression   . Vertigo   . GERD (gastroesophageal reflux disease)   . Arrhythmia   . Anxiety   . Complete heart block (HCC) 02/13/2013    CMP     Component Value Date/Time   NA 132 (A) 10/15/2017   K 4.1 10/15/2017   CL 90 (L) 12/02/2016 0511   CO2 25 12/02/2016 0232   GLUCOSE 78 12/02/2016 0511   BUN 28 (A) 10/15/2017   CREATININE 0.9 10/15/2017   CREATININE 0.50 12/02/2016 0511   CREATININE 0.89 03/01/2013 1147   CALCIUM 8.7 (L) 12/02/2016 0232   PROT 6.2 (L) 12/02/2016 0232   ALBUMIN 4.2 12/02/2016 0232   AST 19 10/10/2017   ALT 20 10/10/2017   ALKPHOS 93 10/06/2017   BILITOT 1.1 12/02/2016 0232   GFRNONAA >60 12/02/2016 0232   GFRAA >60 12/02/2016 0232   Recent Labs    12/02/16 0232 12/02/16 0511  06/13/17 06/20/17 10/10/17 10/15/17  NA 121* 126*   < > 134* 139 131* 132*  K 3.1* 3.0*   < > 4.2  --  4.1 4.1  CL 86* 90*  --   --   --   --   --   CO2 25  --   --   --   --   --   --   GLUCOSE 107* 78  --   --   --   --   --   BUN 14 13   < > 14  --  24* 28*  CREATININE 0.69 0.50   < > 0.9  --  0.8 0.9  CALCIUM 8.7*  --   --   --   --   --   --    < > = values in this interval not displayed.   Recent Labs    12/02/16 0232 04/25/17 06/08/17 10/06/17 10/10/17  AST 20 25 21   --  19  ALT 14 19 20   --  20  ALKPHOS 92 95 77 93  --   BILITOT 1.1  --   --   --   --   PROT 6.2*  --   --   --   --  ALBUMIN 4.2  --   --   --   --    Recent Labs    12/02/16 0232  06/08/17 10/10/17 10/15/17  WBC 7.9   < > 7.4 7.8 9.9  NEUTROABS 5.9  --   --   --   --   HGB 14.8   < > 12.4 11.4* 10.7*  HCT 40.6   < > 36 34* 33*  MCV 89.2  --   --   --   --   PLT 259   < > 155 297 295   < > = values in this interval not displayed.   Recent Labs    02/03/17  CHOL 162  LDLCALC 86  TRIG 52   No results found for: MICROALBUR TSH-0.863 No results found for: HGBA1C Lab Results  Component Value Date   CHOL 162 02/03/2017   HDL 66 02/03/2017    LDLCALC 86 02/03/2017   TRIG 52 02/03/2017   CHOLHDL 2.1 02/18/2013    Significant Diagnostic Results in last 30 days:  No results found.  Assessment and Plan  Complaint of shortness of breath-but I don't see any evidence of it on exam; I do elicit some history of mouth dryness or difficulty swallowing-have spoken to speech therapy and there are no problems with her mechanical swallowing; will start biotin mouthwash several times a day; will continue to monitor      Tonya Lupien D. Tonya HollingsheadAlexander, MD

## 2017-11-29 ENCOUNTER — Non-Acute Institutional Stay (SKILLED_NURSING_FACILITY): Payer: Medicare Other | Admitting: Internal Medicine

## 2017-11-29 DIAGNOSIS — N390 Urinary tract infection, site not specified: Secondary | ICD-10-CM | POA: Diagnosis not present

## 2017-11-29 DIAGNOSIS — B952 Enterococcus as the cause of diseases classified elsewhere: Secondary | ICD-10-CM | POA: Diagnosis not present

## 2017-12-06 ENCOUNTER — Ambulatory Visit (INDEPENDENT_AMBULATORY_CARE_PROVIDER_SITE_OTHER): Payer: Medicare Other | Admitting: Neurology

## 2017-12-06 ENCOUNTER — Non-Acute Institutional Stay (SKILLED_NURSING_FACILITY): Payer: Medicare Other | Admitting: Internal Medicine

## 2017-12-06 ENCOUNTER — Encounter: Payer: Self-pay | Admitting: Internal Medicine

## 2017-12-06 ENCOUNTER — Encounter: Payer: Self-pay | Admitting: Neurology

## 2017-12-06 VITALS — BP 106/58 | HR 62

## 2017-12-06 DIAGNOSIS — E538 Deficiency of other specified B group vitamins: Secondary | ICD-10-CM | POA: Diagnosis not present

## 2017-12-06 DIAGNOSIS — J3089 Other allergic rhinitis: Secondary | ICD-10-CM

## 2017-12-06 DIAGNOSIS — Z8679 Personal history of other diseases of the circulatory system: Secondary | ICD-10-CM | POA: Diagnosis not present

## 2017-12-06 DIAGNOSIS — G8220 Paraplegia, unspecified: Secondary | ICD-10-CM | POA: Diagnosis not present

## 2017-12-06 DIAGNOSIS — H3530 Unspecified macular degeneration: Secondary | ICD-10-CM | POA: Diagnosis not present

## 2017-12-06 DIAGNOSIS — R531 Weakness: Secondary | ICD-10-CM

## 2017-12-06 DIAGNOSIS — Z87898 Personal history of other specified conditions: Secondary | ICD-10-CM

## 2017-12-06 NOTE — Progress Notes (Signed)
[facility]    Provider:  Dr Lucia Gaskins Referring Provider:  Merrilee Seashore Primary Care Physician:  Merrilee Seashore  CC:    HPI:  Tonya Maddox is a 84 y.o. female here as a referral from Dr. Providence Lanius for instability.  Past medical history difficulty in walking, dementia, hyperosmolality and hyponatremia, cardiac arrhythmias, cardiac pacemaker, long-term use of anticoagulants, chronic pain, dorsalgia, depressive disorder, muscle weakness, essential hypertension, dysphasia.. She can;t hear, brother provides all information. He says she has been in and out of the hospital multiple times. She has been weak since the last time in the hospital. She has continued to get weaker mostly in the legs. She has bulging disks and lots of arthritis. She continues to have much back pain. The bulging disks hurt her. She complains a lot about her back when she lays in bed. She has neck pain as well, shoulder pain, significant pain all over. Her whole body hurts. Difficult to get a good history from patient and her brother. She is on Xanax and tramadol.  She is having trouble with bowels moving as well.   Reviewed notes, labs and imaging from outside physicians, which showed:  CT head 10/2017 report 10/2017: : 1. Stable atrophy with chronic small vessel ischemic disease. No acute intracranial abnormality. 2. Stable right frontal calcified meningioma.  Reviewed physicians notes.  She has chronic back pain.  Patient has fallen in the past and twisted.  She has a stair lift chair but she was not using.  X-rays at the time were negative.  She continues have some pain in her back.  She takes hydrocodone daily for arthritis pain.  She started having more pain in her back after her fall and started taking her hydrocodone every 4 hours.  She continues to have back pain post fall.  She developed constipation from pain medication.  She is having abdominal pain from the constipation.  She has dementia.  She  was diagnosed with fecal impaction related to her hydrocodone use.  Patient sodium improved with hydration.  She continued to complain of pain in the back and abdomen since her injury in December.  She presented multiple other times for low back pain.  In January 2019 she had an acute visit after being discharged physical therapy report of the patient prior was minimal assist patient no truncal stability extreme weakness on both sides left greater than the right cannot walk at all because she crumbles over, patient cannot feed herself because she has upper extremity ataxia in addition to weakness.  Speech therapy is noted that patient speech is dysarthric she has difficulty with volume control and speech and is at times slurred.  Review of Systems: Patient complains of symptoms per HPI as well as the following symptoms: numbness, weakness. Pertinent negatives and positives per HPI. All others negative.   Social History   Socioeconomic History  . Marital status: Single    Spouse name: Not on file  . Number of children: 0  . Years of education: Not on file  . Highest education level: Not on file  Social Needs  . Financial resource strain: Not on file  . Food insecurity - worry: Not on file  . Food insecurity - inability: Not on file  . Transportation needs - medical: Not on file  . Transportation needs - non-medical: Not on file  Occupational History  . Occupation: retired Glass blower/designer  Tobacco Use  . Smoking status: Never Smoker  . Smokeless tobacco: Never Used  Substance and Sexual Activity  . Alcohol use: No  . Drug use: No  . Sexual activity: No  Other Topics Concern  . Not on file  Social History Narrative   Lives at [facility]   Used to work in Designer, fashion/clothing   Denies cigarettes, alcohol, other drugs    Admitted to [facility] 04/28/17. Re-admitted 10/11/17   DNR    Family History  Problem Relation Age of Onset  . Heart Problems Unknown        died at 38  . Stroke Father   .  Aneurysm Brother   . Aneurysm Brother     Past Medical History:  Diagnosis Date  . Allergy history unknown   . Anxiety   . Arrhythmia   . Arthritis    knees and right hip  . Atrial flutter (HCC) 04/07/2016  . Bradycardia    2004  . Chronic anticoagulation 04/07/2016  . Chronic back pain   . Complete heart block (HCC) 02/2013  . Dementia 12/02/2016   Per patient, mild.  . Depression   . Dorsalgia, unspecified   . Dysphagia, oropharyngeal phase   . GERD (gastroesophageal reflux disease)   . GERD without esophagitis   . Hard of hearing   . Hearing difficulty   . Heart block AV third degree (HCC) 02/18/2013  . Hypercalcemia 03/01/2013  . Hypertension   . Hypokalemia 04/30/2017  . Hyponatremia 02/18/2013  . Iron deficiency anemia   . Long term (current) use of anticoagulants   . Major depressive disorder, single episode, unspecified   . Obesity (BMI 30-39.9)   . Osteoporosis   . Osteoporosis   . Other chronic pain   . Other symbolic dysfunctions   . Pacemaker   . Pressure ulcer of sacrum    stage 3; per facesheet from [employer] on 12/06/17  . Pulmonary edema 09/29/2017  . Pulmonary edema   . S/P placement of cardiac pacemaker 02/20/2013   02/20/23 in setting of complete heart block   . Sepsis (HCC)   . Severe tricuspid regurgitation by prior echocardiogram 10/16/2017  . Shortness of breath 09/29/2017  . Syncope    2005  . Syncope   . Urinary retention   . Urinary tract infection   . Vertigo   . Vitamin B12 deficiency 10/16/2017    Past Surgical History:  Procedure Laterality Date  . APPENDECTOMY    . CHOLECYSTECTOMY    . KNEE ARTHROSCOPY    . PERMANENT PACEMAKER INSERTION N/A 02/19/2013   Procedure: PERMANENT PACEMAKER INSERTION;  Surgeon: Marinus Maw, MD;  Location: [facility] CATH LAB;  Service: Cardiovascular;  Laterality: N/A;    Current Outpatient Medications  Medication Sig Dispense Refill  . albuterol (PROAIR HFA) 108 (90 Base) MCG/ACT inhaler Inhale into  the lungs. Inhale 2 puff every 6 hours as needed for wheezing    . ALPRAZolam (XANAX) 0.5 MG tablet Take 0.5 mg by mouth 2 (two) times daily as needed for anxiety.     . Amino Acids-Protein Hydrolys (FEEDING SUPPLEMENT, PRO-STAT SUGAR FREE 64,) LIQD Take 30 mLs by mouth 2 (two) times daily. To aid in wound healing    . apixaban (ELIQUIS) 5 MG TABS tablet Take 5 mg by mouth 2 (two) times daily.    Marland Kitchen esomeprazole (NEXIUM) 40 MG capsule Take 40 mg by mouth daily before breakfast.     . fexofenadine (ALLEGRA) 180 MG tablet Take 180 mg by mouth daily as needed for allergies.     Marland Kitchen  fluticasone (FLONASE) 50 MCG/ACT nasal spray Place 1 spray into both nostrils. Daily as needed for rhinitis    . furosemide (LASIX) 20 MG tablet Take 20 mg by mouth 2 (two) times daily.     Marland Kitchen. gabapentin (NEURONTIN) 100 MG capsule Take 100 mg by mouth 3 (three) times daily.     . magnesium oxide (MAG-OX) 400 MG tablet Take 400 mg by mouth daily.    . Multiple Vitamins-Minerals (MULTIVITAMIN WITH MINERALS) tablet Take 1 tablet by mouth daily.    . Multiple Vitamins-Minerals (PRESERVISION AREDS PO) Take by mouth. Take one tablet twice daily as supplement for eyes    . nutrition supplement, JUVEN, (JUVEN) PACK Take 1 packet by mouth 2 (two) times daily between meals. To aid in wound healing    . Nutritional Supplements (ENSURE PO) Take 1 Bottle by mouth 2 (two) times daily between meals.     . ondansetron (ZOFRAN-ODT) 4 MG disintegrating tablet Take 4 mg by mouth. Take one tablet every 8 hours as needed for nausea    . Polyethyl Glycol-Propyl Glycol (SYSTANE OP) Place 1 drop into both eyes as needed (dry eyes).     . polyethylene glycol (MIRALAX / GLYCOLAX) packet Take 17 g by mouth daily as needed.    Bernadette Hoit. Sennosides-Docusate Sodium (SENNA PLUS PO) Take 1 tablet by mouth at bedtime as needed (constipation).    . simethicone (MYLICON) 80 MG chewable tablet Chew 80 mg by mouth 4 (four) times daily -  before meals and at bedtime.       . sodium chloride 1 g tablet Take 1 g by mouth 2 (two) times daily.     . traMADol (ULTRAM) 50 MG tablet Take 50 mg by mouth every 8 (eight) hours. For chronic pain    . valsartan (DIOVAN) 160 MG tablet Take 160 mg by mouth daily.    . vitamin B-12 (CYANOCOBALAMIN) 250 MCG tablet Take 250 mcg by mouth daily. B-12 supplment    . vitamin C (ASCORBIC ACID) 500 MG tablet Take 500 mg by mouth 2 (two) times daily. To promote wound healing    . ZINC SULFATE PO Take 220 mg by mouth daily. x14 days to aid in wound healing     No current facility-administered medications for this visit.     Allergies as of 12/06/2017 - Review Complete 12/06/2017  Allergen Reaction Noted  . Influenza vaccines Shortness Of Breath, Swelling, and Rash 02/10/2017  . Aleve [naproxen sodium] Other (See Comments) 05/29/2013  . Aspirin Other (See Comments) 05/29/2013  . Terazosin Other (See Comments) 07/02/2014    Vitals: BP (!) 106/58 (BP Location: Left Arm, Patient Position: Sitting)   Pulse 62  Last Weight:  Wt Readings from Last 1 Encounters:  12/06/17 160 lb (72.6 kg)   Last Height:   Ht Readings from Last 1 Encounters:  12/06/17 5\' 3"  (1.6 m)   Physical exam: Exam: Gen: NAD, conversant, well nourised, obese, well groomed                     CV: RRR, no MRG. No Carotid Bruits. Right leg edema >> left leg edema, warm, nontender Eyes: Conjunctivae clear without exudates or hemorrhage  Neuro: Detailed Neurologic Exam  Speech:    Speech is normal Cognition:     The patient is oriented to person    recent and remote memory impaired ;     language fluent;     Impaired attention, concentration, fund of knowledge Cranial  Nerves:    The pupils are equal, round, and reactive to light.  Funduscopic exam could not visualize due to small pupils.  visual fields are full to finger confrontation.  Impaired upgaze otherwise extraocular movements are intact. Trigeminal sensation is intact and the muscles of  mastication are normal. The face is symmetric. The palate elevates in the midline. Hearing impaired to voice. Voice is normal. Shoulder shrug is normal. The tongue has normal motion without fasciculations.   Coordination: No dysmetria apparent      Gait:    Patient cannot stand unassisted  Motor Observation:     no involuntary movements noted. Tone:    Decreased muscle tone.    Posture:    Posture is not erect in wheelchair, unclear during gait as patient cannot stand unassisted    Strength: arms are antigravity, lower extremities are 0-1 out of 5, also with trunk ataxia, neck weakness     Sensation: dense absence to pin prick from the neck and below     Reflex Exam:  DTR's:    Deep tendon reflexes in the upper and lower extremities are absent lowers bilaterally.   Toes:    The toes are equivocal bilaterally.   Clonus:    Clonus is absent.       Assessment/Plan: This is a patient who is here with her brother who also provides much information.  Patient with progressive difficulty in walking, instability, muscle weakness.  Weakness progressing since multiple hospitalizations.  Her lower extremities are very weak 0-1/5, absent reflexes, numbness from the neck and below.  Unclear etiology.  Could be multifactorial.  CT of the head was unremarkable for etiology.  Needs CT of the cervical and thoracic spine to evaluate for spinal stenosis or myelopathy or any lesions of the spinal cord that could cause lower extremity paralysis, ataxia.  Also needs CT of the low back for spinal stenosis of the lumbar spine causing lower extremity paraplegia.  If this workup is negative, can order lumbar puncture for CIDP and an EMG nerve conduction study.  We will also check labs today such as CK and B12.   Orders Placed This Encounter  Procedures  . CT CERVICAL SPINE WO CONTRAST  . CT LUMBAR SPINE WO CONTRAST  . CT THORACIC SPINE WO CONTRAST  . CK  . B12 and Folate Panel  . Methylmalonic acid,  serum  . Heavy Metals Profile, Urine  . Specimen status report  . B12 and Folate Panel  . Specimen status report   Cc; Dr. Teodora Medici, MD  [facility] [address] [place], [place] [address]  Phone [phone] Fax [phone]

## 2017-12-06 NOTE — Patient Instructions (Addendum)
CT of neck and entire back Labs today If negative can consider an emg/ncs, LP

## 2017-12-06 NOTE — Progress Notes (Signed)
Location:  [facility] Room Number: 309D Place of Service:  SNF [phone]  Randon Goldsmithnne D. Lyn HollingsheadAlexander, MD  Patient Care Team: Devra DoppHowell, Tamieka, MD as PCP - General [facility](Family Medicine)  Extended Emergency Contact Information Primary Emergency Contact: Penagos,W.H. Address: [address]          [place], [place] [id number] [place] Home Phone: [phone] Relation: Brother    Allergies: Influenza vaccines; Aleve [naproxen sodium]; Aspirin; and Terazosin  Chief Complaint  Patient presents with  . Medical Management of Chronic Issues    Routine Visit    HPI: Patient is 84 y.o. female who is being seen for routine issues of history of bradycardia, macular degeneration, and allergic rhinitis.  Past Medical History:  Diagnosis Date  . Allergy history unknown   . Anxiety   . Arrhythmia   . Arthritis    knees and right hip  . Atrial flutter (HCC) 04/07/2016  . Bradycardia    2004  . Chronic anticoagulation 04/07/2016  . Chronic back pain   . Complete heart block (HCC) 02/2013  . Dementia 12/02/2016   Per patient, mild.  . Depression   . Dorsalgia, unspecified   . Dysphagia, oropharyngeal phase   . GERD (gastroesophageal reflux disease)   . GERD without esophagitis   . Hard of hearing   . Hearing difficulty   . Heart block AV third degree (HCC) 02/18/2013  . Hypercalcemia 03/01/2013  . Hypertension   . Hypokalemia 04/30/2017  . Hyponatremia 02/18/2013  . Iron deficiency anemia   . Long term (current) use of anticoagulants   . Major depressive disorder, single episode, unspecified   . Obesity (BMI 30-39.9)   . Osteoporosis   . Osteoporosis   . Other chronic pain   . Other symbolic dysfunctions   . Pacemaker   . Pressure ulcer of sacrum    stage 3; per facesheet from [facility] printed on 12/06/17  . Pulmonary edema 09/29/2017  . Pulmonary edema   . S/P placement of cardiac pacemaker 02/20/2013   02/20/23 in setting of complete heart block   .  Sepsis (HCC)   . Severe tricuspid regurgitation by prior echocardiogram 10/16/2017  . Shortness of breath 09/29/2017  . Syncope    2005  . Syncope   . Urinary retention   . Urinary tract infection   . Vertigo   . Vitamin B12 deficiency 10/16/2017    Past Surgical History:  Procedure Laterality Date  . APPENDECTOMY    . CHOLECYSTECTOMY    . KNEE ARTHROSCOPY    . PERMANENT PACEMAKER INSERTION N/A 02/19/2013   Procedure: PERMANENT PACEMAKER INSERTION;  Surgeon: Marinus MawGregg W Taylor, MD;  [facility];  Service: Cardiovascular;  Laterality: N/A;    Allergies as of 12/06/2017      Reactions   Influenza Vaccines Shortness Of Breath, Swelling, Rash   Aleve [naproxen Sodium] Other (See Comments)   Pt was advised by her MD not to take this medication.    Aspirin Other (See Comments)   Reaction:  Causes pts stomach to burn    Terazosin Other (See Comments)   Reaction:  Muscle pain       Medication List        Accurate as of 12/06/17 11:59 PM. Always use your most recent med list.          ALPRAZolam 0.5 MG tablet Commonly known as:  XANAX Take 0.25-0.5 mg by mouth as directed. Give 0.25 mg every  AM and 0.5 mg at bedtime.   ELIQUIS 5 MG Tabs tablet Generic drug:  apixaban Take 5 mg by mouth 2 (two) times daily.   esomeprazole 40 MG capsule Commonly known as:  NEXIUM Take 40 mg by mouth daily before breakfast.   feeding supplement (PRO-STAT SUGAR FREE 64) Liqd Take 30 mLs by mouth 2 (two) times daily. To aid in wound healing   fexofenadine 180 MG tablet Commonly known as:  ALLEGRA Take 180 mg by mouth daily as needed for allergies.   fluticasone 50 MCG/ACT nasal spray Commonly known as:  FLONASE Place 1 spray into both nostrils. Daily as needed for rhinitis   furosemide 20 MG tablet Commonly known as:  LASIX Take 20 mg by mouth 2 (two) times daily.   gabapentin 100 MG capsule Commonly known as:  NEURONTIN Take 100 mg by mouth 3 (three) times daily.   magnesium  oxide 400 MG tablet Commonly known as:  MAG-OX Take 400 mg by mouth daily.   multivitamin with minerals tablet Take 1 tablet by mouth daily.   PRESERVISION AREDS PO Take by mouth. Take one tablet twice daily as supplement for eyes   nutrition supplement (JUVEN) Pack Take 1 packet by mouth 2 (two) times daily between meals. To aid in wound healing   ENSURE PO Take 1 Bottle by mouth 2 (two) times daily between meals.   ondansetron 4 MG disintegrating tablet Commonly known as:  ZOFRAN-ODT Take 4 mg by mouth. Take one tablet every 8 hours as needed for nausea   polyethylene glycol packet Commonly known as:  MIRALAX / GLYCOLAX Take 17 g by mouth daily as needed.   PROAIR HFA 108 (90 Base) MCG/ACT inhaler Generic drug:  albuterol Inhale into the lungs. Inhale 2 puff every 6 hours as needed for wheezing   SENNA PLUS PO Take 1 tablet by mouth at bedtime as needed (constipation).   simethicone 80 MG chewable tablet Commonly known as:  MYLICON Chew 80 mg by mouth 4 (four) times daily -  before meals and at bedtime.   sodium chloride 1 g tablet Take 1 g by mouth 2 (two) times daily.   SYSTANE OP Place 1 drop into both eyes as needed (dry eyes).   traMADol 50 MG tablet Commonly known as:  ULTRAM Take 50 mg by mouth every 8 (eight) hours. For chronic pain   valsartan 160 MG tablet Commonly known as:  DIOVAN Take 160 mg by mouth daily.   vitamin B-12 250 MCG tablet Commonly known as:  CYANOCOBALAMIN Take 250 mcg by mouth daily. B-12 supplment   vitamin C 500 MG tablet Commonly known as:  ASCORBIC ACID Take 500 mg by mouth 2 (two) times daily. To promote wound healing   ZINC SULFATE PO Take 220 mg by mouth daily. x14 days to aid in wound healing       No orders of the defined types were placed in this encounter.   Immunization History  Administered Date(s) Administered  . Pneumococcal Conjugate-13 12/16/2014  . Pneumococcal Polysaccharide-23 07/11/2013  . Td  02/06/2009    Social History   Tobacco Use  . Smoking status: Never Smoker  . Smokeless tobacco: Never Used  Substance Use Topics  . Alcohol use: No    Review of Systems  DATA OBTAINED: from patient, nurse GENERAL:  no fevers, fatigue, appetite changes SKIN: No itching, rash HEENT: No complaint RESPIRATORY: No cough, wheezing, SOB CARDIAC: No chest pain, palpitations, lower extremity edema  GI: No  abdominal pain, No N/V/D or constipation, No heartburn or reflux  GU: No dysuria, frequency or urgency, or incontinence  MUSCULOSKELETAL: No unrelieved bone/joint pain NEUROLOGIC: No headache, dizziness  PSYCHIATRIC: No overt anxiety or sadness  Vitals:   12/06/17 1614  BP: (!) 96/56  Pulse: 73  Resp: 18  Temp: 98.2 F (36.8 C)  SpO2: 98%   Body mass index is 28.34 kg/m. Physical Exam  GENERAL APPEARANCE: Alert, conversant, No acute distress  SKIN: No diaphoresis rash HEENT: Unremarkable RESPIRATORY: Breathing is even, unlabored. Lung sounds are clear   CARDIOVASCULAR: Heart RRR no murmurs, rubs or gallops. No peripheral edema  GASTROINTESTINAL: Abdomen is soft, non-tender, not distended w/ normal bowel sounds.  GENITOURINARY: Bladder non tender, not distended  MUSCULOSKELETAL: No abnormal joints or musculature NEUROLOGIC: Cranial nerves 2-12 grossly intact. Moves all extremities PSYCHIATRIC: Mood and affect appropriate to situation, no behavioral issues  Patient Active Problem List   Diagnosis Date Noted  . Macular degeneration 12/15/2017  . Allergic rhinitis 12/15/2017  . Paraplegia (HCC) 12/12/2017  . UTI (urinary tract infection) 11/12/2017  . Dehydration 11/12/2017  . Acute metabolic encephalopathy 11/12/2017  . Polyneuropathy 11/12/2017  . Acute on chronic congestive heart failure (HCC) 10/16/2017  . Pulmonary hypertension, primary (HCC) 10/16/2017  . Severe tricuspid regurgitation by prior echocardiogram 10/16/2017  . Acute urinary retention 10/16/2017    . Vitamin B12 deficiency 10/16/2017  . History of bradycardia 07/01/2017  . Abdominal pain 04/30/2017  . Constipation 04/30/2017  . Klebsiella cystitis 04/30/2017  . Hypokalemia 04/30/2017  . Chronic back pain   . Osteoporosis   . Allergy history unknown   . Pacemaker   . Hearing difficulty   . Chest pain 04/07/2016  . Atrial flutter (HCC) 04/07/2016  . Chronic anticoagulation 04/07/2016  . Hypertension 03/01/2013  . Hypercalcemia 03/01/2013  . S/P placement of cardiac pacemaker 02/20/2013  . Bradycardia 02/18/2013  . Hyponatremia 02/18/2013  . Chest tightness or pressure 02/18/2013  . Heart block AV third degree (HCC) 02/18/2013  . Depression   . Vertigo   . GERD (gastroesophageal reflux disease)   . Arrhythmia   . Anxiety   . Complete heart block (HCC) 02/13/2013    CMP     Component Value Date/Time   NA 132 (A) 10/15/2017   K 4.1 10/15/2017   CL 90 (L) 12/02/2016 0511   CO2 25 12/02/2016 0232   GLUCOSE 78 12/02/2016 0511   BUN 28 (A) 10/15/2017   CREATININE 0.9 10/15/2017   CREATININE 0.50 12/02/2016 0511   CREATININE 0.89 03/01/2013 1147   CALCIUM 8.7 (L) 12/02/2016 0232   PROT 6.2 (L) 12/02/2016 0232   ALBUMIN 4.2 12/02/2016 0232   AST 19 10/10/2017   ALT 20 10/10/2017   ALKPHOS 93 10/06/2017   BILITOT 1.1 12/02/2016 0232   GFRNONAA >60 12/02/2016 0232   GFRAA >60 12/02/2016 0232   Recent Labs    06/13/17 06/20/17 10/10/17 10/15/17  NA 134* 139 131* 132*  K 4.2  --  4.1 4.1  BUN 14  --  24* 28*  CREATININE 0.9  --  0.8 0.9   Recent Labs    04/25/17 06/08/17 10/06/17 10/10/17  AST 25 21  --  19  ALT 19 20  --  20  ALKPHOS 95 77 93  --    Recent Labs    06/08/17 10/10/17 10/15/17  WBC 7.4 7.8 9.9  HGB 12.4 11.4* 10.7*  HCT 36 34* 33*  PLT 155 297 295   Recent  Labs    02/03/17  CHOL 162  LDLCALC 86  TRIG 52   No results found for: MICROALBUR TSH-.863 No results found for: HGBA1C Lab Results  Component Value Date   CHOL 162  02/03/2017   HDL 66 02/03/2017   LDLCALC 86 02/03/2017   TRIG 52 02/03/2017   CHOLHDL 2.1 02/18/2013    Significant Diagnostic Results in last 30 days:  No results found.  Assessment and Plan  History of bradycardia Status post pacemaker; we'll monitor rate  Macular degeneration Continue PreserVision AREDS 1 by mouth twice a day  Allergic rhinitis Chronic and stable; continue Allegra 180 mg by mouth daily and Flonase 2 sprays daily    Deshunda Thackston D.[name], MD

## 2017-12-07 ENCOUNTER — Telehealth: Payer: Self-pay | Admitting: Neurology

## 2017-12-07 NOTE — Telephone Encounter (Signed)
Tonya Maddox/[name] [phone] request lab results from yesterday faxed to [phone]. She is also wanting to know about the CT that is to be ordered. Please call to advise

## 2017-12-08 NOTE — Telephone Encounter (Signed)
Called Tonya Maddox @ [facility]. I advised that labs are pending. Also that the facility would receive a call to set up appointment for CT scan. She verbalized understanding and would like for us to call them regarding the scan & lab results as they are involved in her care. She verbalized appreciation.

## 2017-12-09 ENCOUNTER — Telehealth: Payer: Self-pay | Admitting: *Deleted

## 2017-12-09 LAB — CK: Total CK: 16 U/L — ABNORMAL LOW (ref 24–173)

## 2017-12-09 LAB — METHYLMALONIC ACID, SERUM: METHYLMALONIC ACID: 408 nmol/L — AB (ref 0–378)

## 2017-12-09 NOTE — Telephone Encounter (Signed)
They have been ordered thanks

## 2017-12-09 NOTE — Telephone Encounter (Signed)
-----   Message from Anson FretAntonia B Ahern, MD sent at 12/09/2017  8:06 AM EST ----- Labs unremarkable.

## 2017-12-09 NOTE — Telephone Encounter (Signed)
Called pt's brother [place] ([place] Informed him that Ellenore's labs are unremarkable. Also informed him that RN would send results to [facility]. He verbalized understanding and appreciation.   [facility] [phone]. Received a receipt of confirmation.

## 2017-12-10 ENCOUNTER — Encounter: Payer: Self-pay | Admitting: Internal Medicine

## 2017-12-10 NOTE — Progress Notes (Signed)
[facility]   Place of Service:  SNF [phone]) Provider: Margit Hanks MD   Patient Care Team: Devra Dopp, MD as PCP - General (Family Medicine)  Extended Emergency Contact Information Primary Emergency Contact: Coleson,W.H. Address: [address]          [place], [place] [address] [place] Home Phone: [phone] Relation: Brother    Allergies: Influenza vaccines; Aleve [naproxen sodium]; Aspirin; and Terazosin  Chief Complaint  Patient presents with  . Acute Visit    HPI: Patient is 84 y.o. female who nursing asked me to see for an acute UTI. Several days ago patient had had some pain with urination and was acting a little different. No reported fever, nausea vomiting. Today patient should return return with greater than 100,000 of enterococcus faecalis.  Past Medical History:  Diagnosis Date  . Allergy history unknown   . Anxiety   . Arrhythmia   . Arthritis    knees and right hip  . Atrial flutter (HCC) 04/07/2016  . Bradycardia    2004  . Chronic anticoagulation 04/07/2016  . Chronic back pain   . Complete heart block (HCC) 02/2013  . Dementia 12/02/2016   Per patient, mild.  . Depression   . Dorsalgia, unspecified   . Dysphagia, oropharyngeal phase   . GERD (gastroesophageal reflux disease)   . GERD without esophagitis   . Hard of hearing   . Hearing difficulty   . Heart block AV third degree (HCC) 02/18/2013  . Hypercalcemia 03/01/2013  . Hypertension   . Hypokalemia 04/30/2017  . Hyponatremia 02/18/2013  . Iron deficiency anemia   . Long term (current) use of anticoagulants   . Major depressive disorder, single episode, unspecified   . Obesity (BMI 30-39.9)   . Osteoporosis   . Osteoporosis   . Other chronic pain   . Other symbolic dysfunctions   . Pacemaker   . Pressure ulcer of sacrum    stage 3; per facesheet from [facility] on 12/06/17  . Pulmonary edema 09/29/2017  . Pulmonary edema   . S/P  placement of cardiac pacemaker 02/20/2013   02/20/23 in setting of complete heart block   . Sepsis (HCC)   . Severe tricuspid regurgitation by prior echocardiogram 10/16/2017  . Shortness of breath 09/29/2017  . Syncope    2005  . Syncope   . Urinary retention   . Urinary tract infection   . Vertigo   . Vitamin B12 deficiency 10/16/2017    Past Surgical History:  Procedure Laterality Date  . APPENDECTOMY    . CHOLECYSTECTOMY    . KNEE ARTHROSCOPY    . PERMANENT PACEMAKER INSERTION N/A 02/19/2013   Procedure: PERMANENT PACEMAKER INSERTION;  Surgeon: Marinus Maw, MD;  Location: [facility] CATH LAB;  Service: Cardiovascular;  Laterality: N/A;    Allergies as of 11/29/2017      Reactions   Influenza Vaccines Rash   Aleve [naproxen Sodium] Other (See Comments)   Pt was advised by her MD not to take this medication.    Aspirin Other (See Comments)   Reaction:  Causes pts stomach to burn    Terazosin Other (See Comments)   Reaction:  Muscle pain       Medication List        Accurate as of 11/29/17 11:59 PM. Always use your most recent med list.          ALPRAZolam 0.5 MG tablet Commonly known as:  XANAX Take 0.5 mg by mouth 2 (two) times daily as needed for anxiety.   amLODipine 2.5 MG tablet Commonly known as:  NORVASC Take 2.5 mg by mouth. Take one tablet daily for blood pressure   ELIQUIS 5 MG Tabs tablet Generic drug:  apixaban Take 5 mg by mouth 2 (two) times daily.   esomeprazole 40 MG capsule Commonly known as:  NEXIUM Take 40 mg by mouth daily before breakfast.   fexofenadine 180 MG tablet Commonly known as:  ALLEGRA Take 180 mg by mouth daily as needed for allergies.   fluticasone 50 MCG/ACT nasal spray Commonly known as:  FLONASE Place 1 spray into both nostrils. Daily as needed for rhinitis   furosemide 20 MG tablet Commonly known as:  LASIX Take 20 mg by mouth 2 (two) times daily.   gabapentin 100 MG capsule Commonly known as:  NEURONTIN Take 100 mg by  mouth 3 (three) times daily.   magnesium oxide 400 MG tablet Commonly known as:  MAG-OX Take 400 mg by mouth daily.   multivitamin with minerals tablet Take 1 tablet by mouth daily.   PRESERVISION AREDS PO Take by mouth. Take one tablet twice daily as supplement for eyes   ondansetron 4 MG disintegrating tablet Commonly known as:  ZOFRAN-ODT Take 4 mg by mouth. Take one tablet every 8 hours as needed for nausea   polyethylene glycol packet Commonly known as:  MIRALAX / GLYCOLAX Take 17 g by mouth daily as needed.   PROAIR HFA 108 (90 Base) MCG/ACT inhaler Generic drug:  albuterol Inhale into the lungs. Inhale 2 puff every 6 hours as needed for wheezing   SENNA S PO Take by mouth. Take one tablet nightly for constipation   simethicone 80 MG chewable tablet Commonly known as:  MYLICON Chew 80 mg by mouth 4 (four) times daily -  before meals and at bedtime.   sodium chloride 1 g tablet Take 1 g by mouth 2 (two) times daily.   SYSTANE OP Place 1 drop into both eyes as needed (dry eyes).   valsartan 320 MG tablet Commonly known as:  DIOVAN Take 320 mg by mouth daily before breakfast.   vitamin B-12 250 MCG tablet Commonly known as:  CYANOCOBALAMIN Take 250 mcg by mouth daily. B-12 supplment       No orders of the defined types were placed in this encounter.   Immunization History  Administered Date(s) Administered  . Pneumococcal Conjugate-13 12/16/2014  . Pneumococcal Polysaccharide-23 07/11/2013  . Td 02/06/2009    Social History   Tobacco Use  . Smoking status: Never Smoker  . Smokeless tobacco: Never Used  Substance Use Topics  . Alcohol use: No    Review of Systems  DATA OBTAINED: from patient, nurse GENERAL:  no fevers, fatigue, appetite changes SKIN: No itching, rash HEENT: No complaint RESPIRATORY: No cough, wheezing, SOB CARDIAC: No chest pain, palpitations, lower extremity edema  GI: No abdominal pain, No N/V/D or constipation, No  heartburn or reflux  GU: + dysuria, frequency, no urgency; incontinence -patient with diaper MUSCULOSKELETAL: No unrelieved bone/joint pain NEUROLOGIC: No headache, dizziness  PSYCHIATRIC: No overt anxiety or sadness  Vitals:   12/10/17 2225  BP: (!) 94/55  Pulse: 61  Resp: 20  Temp: (!) 97.1 F (36.2 C)  SpO2: 98%   There is no height or weight on file to calculate BMI. Physical Exam  GENERAL APPEARANCE: Alert, conversant, No acute distress  SKIN: No diaphoresis rash HEENT: Unremarkable RESPIRATORY: Breathing  is even, unlabored. Lung sounds are clear   CARDIOVASCULAR: Heart RRR no murmurs, rubs or gallops. No peripheral edema  GASTROINTESTINAL: Abdomen is soft, non-tender, not distended w/ normal bowel sounds.  GENITOURINARY: Bladder mild tender, not distended  MUSCULOSKELETAL: No abnormal joints or musculature NEUROLOGIC: Cranial nerves 2-12 grossly intact. Moves all extremities PSYCHIATRIC: Mood and affect appropriate to situation, no behavioral issues  Patient Active Problem List   Diagnosis Date Noted  . UTI (urinary tract infection) 11/12/2017  . Dehydration 11/12/2017  . Acute metabolic encephalopathy 11/12/2017  . Polyneuropathy 11/12/2017  . Acute on chronic congestive heart failure (HCC) 10/16/2017  . Pulmonary hypertension, primary (HCC) 10/16/2017  . Severe tricuspid regurgitation by prior echocardiogram 10/16/2017  . Acute urinary retention 10/16/2017  . Vitamin B12 deficiency 10/16/2017  . History of bradycardia 07/01/2017  . Abdominal pain 04/30/2017  . Constipation 04/30/2017  . Klebsiella cystitis 04/30/2017  . Hypokalemia 04/30/2017  . Chronic back pain   . Osteoporosis   . Allergy history unknown   . Pacemaker   . Hearing difficulty   . Chest pain 04/07/2016  . Atrial flutter (HCC) 04/07/2016  . Chronic anticoagulation 04/07/2016  . Hypertension 03/01/2013  . Hypercalcemia 03/01/2013  . S/P placement of cardiac pacemaker 02/20/2013  .  Bradycardia 02/18/2013  . Hyponatremia 02/18/2013  . Chest tightness or pressure 02/18/2013  . Heart block AV third degree (HCC) 02/18/2013  . Depression   . Vertigo   . GERD (gastroesophageal reflux disease)   . Arrhythmia   . Anxiety   . Complete heart block (HCC) 02/13/2013    CMP     Component Value Date/Time   NA 132 (A) 10/15/2017   K 4.1 10/15/2017   CL 90 (L) 12/02/2016 0511   CO2 25 12/02/2016 0232   GLUCOSE 78 12/02/2016 0511   BUN 28 (A) 10/15/2017   CREATININE 0.9 10/15/2017   CREATININE 0.50 12/02/2016 0511   CREATININE 0.89 03/01/2013 1147   CALCIUM 8.7 (L) 12/02/2016 0232   PROT 6.2 (L) 12/02/2016 0232   ALBUMIN 4.2 12/02/2016 0232   AST 19 10/10/2017   ALT 20 10/10/2017   ALKPHOS 93 10/06/2017   BILITOT 1.1 12/02/2016 0232   GFRNONAA >60 12/02/2016 0232   GFRAA >60 12/02/2016 0232   Recent Labs    06/13/17 06/20/17 10/10/17 10/15/17  NA 134* 139 131* 132*  K 4.2  --  4.1 4.1  BUN 14  --  24* 28*  CREATININE 0.9  --  0.8 0.9   Recent Labs    04/25/17 06/08/17 10/06/17 10/10/17  AST 25 21  --  19  ALT 19 20  --  20  ALKPHOS 95 77 93  --    Recent Labs    06/08/17 10/10/17 10/15/17  WBC 7.4 7.8 9.9  HGB 12.4 11.4* 10.7*  HCT 36 34* 33*  PLT 155 297 295   Recent Labs    02/03/17  CHOL 162  LDLCALC 86  TRIG 52   No results found for: MICROALBUR TSH-0.863 No results found for: HGBA1C Lab Results  Component Value Date   CHOL 162 02/03/2017   HDL 66 02/03/2017   LDLCALC 86 02/03/2017   TRIG 52 02/03/2017   CHOLHDL 2.1 02/18/2013    Significant Diagnostic Results in last 30 days:  No results found.  Assessment and Plan  Enterococcus UTI-patient's creatinine clearance is good enough for patient to be treated with full dose amoxicillin 875 mg twice a day for 7 days  Inocencio Homes, MD

## 2017-12-12 ENCOUNTER — Encounter: Payer: Self-pay | Admitting: Neurology

## 2017-12-12 DIAGNOSIS — G8220 Paraplegia, unspecified: Secondary | ICD-10-CM | POA: Insufficient documentation

## 2017-12-13 ENCOUNTER — Non-Acute Institutional Stay (SKILLED_NURSING_FACILITY): Payer: Medicare Other | Admitting: Internal Medicine

## 2017-12-13 DIAGNOSIS — R609 Edema, unspecified: Secondary | ICD-10-CM

## 2017-12-13 DIAGNOSIS — R635 Abnormal weight gain: Secondary | ICD-10-CM

## 2017-12-14 ENCOUNTER — Encounter: Payer: Self-pay | Admitting: Internal Medicine

## 2017-12-14 NOTE — Progress Notes (Signed)
Opened in error

## 2017-12-15 ENCOUNTER — Encounter: Payer: Self-pay | Admitting: Internal Medicine

## 2017-12-15 DIAGNOSIS — J309 Allergic rhinitis, unspecified: Secondary | ICD-10-CM | POA: Insufficient documentation

## 2017-12-15 DIAGNOSIS — H3530 Unspecified macular degeneration: Secondary | ICD-10-CM | POA: Insufficient documentation

## 2017-12-15 LAB — SPECIMEN STATUS REPORT

## 2017-12-15 NOTE — Assessment & Plan Note (Signed)
Status post pacemaker; we'll monitor rate

## 2017-12-15 NOTE — Telephone Encounter (Addendum)
Completed, MD signed & faxed lab form to [facility]. This was due to add on test B12 to pt's labs drawn previously. Received a receipt of confirmation.

## 2017-12-15 NOTE — Assessment & Plan Note (Signed)
Chronic and stable; continue Allegra 180 mg by mouth daily and Flonase 2 sprays daily

## 2017-12-15 NOTE — Assessment & Plan Note (Signed)
Continue PreserVision AREDS 1 by mouth twice a day

## 2017-12-16 LAB — B12 AND FOLATE PANEL
Folate: 13.9 ng/mL (ref >3.0)
Vitamin B-12: 835 pg/mL (ref 232–1245)

## 2017-12-16 LAB — SPECIMEN STATUS REPORT

## 2017-12-17 ENCOUNTER — Encounter: Payer: Self-pay | Admitting: Internal Medicine

## 2017-12-17 NOTE — Progress Notes (Signed)
Location:   [facility] of Service:   skilled nursing facility Provider:Hamdi Kley Milford Cage MD    Patient Care Team: Devra Dopp, MD as PCP - General (Family Medicine)  Extended Emergency Contact Information Primary Emergency Contact: Duff,W.H. Address: [address]          [place], [place] [address] [place] Home Phone: [phone] Relation: Brother    Allergies: Influenza vaccines; Aleve [naproxen sodium]; Aspirin; and Terazosin  Chief Complaint  Patient presents with  . Acute Visit    HPI: Patient is 84 y.o. female who Is being seen for increased weight gain approximately 8 pounds over the past week. Patient with lower extremity edema. Patient denies chest pain or shortness of breath. Nothing makes it better, nothing makes it worse.  Past Medical History:  Diagnosis Date  . Allergy history unknown   . Anxiety   . Arrhythmia   . Arthritis    knees and right hip  . Atrial flutter (HCC) 04/07/2016  . Bradycardia    2004  . Chronic anticoagulation 04/07/2016  . Chronic back pain   . Complete heart block (HCC) 02/2013  . Dementia 12/02/2016   Per patient, mild.  . Depression   . Dorsalgia, unspecified   . Dysphagia, oropharyngeal phase   . GERD (gastroesophageal reflux disease)   . GERD without esophagitis   . Hard of hearing   . Hearing difficulty   . Heart block AV third degree (HCC) 02/18/2013  . Hypercalcemia 03/01/2013  . Hypertension   . Hypokalemia 04/30/2017  . Hyponatremia 02/18/2013  . Iron deficiency anemia   . Long term (current) use of anticoagulants   . Major depressive disorder, single episode, unspecified   . Obesity (BMI 30-39.9)   . Osteoporosis   . Osteoporosis   . Other chronic pain   . Other symbolic dysfunctions   . Pacemaker   . Pressure ulcer of sacrum    stage 3; per facesheet from [employer] on 12/06/17  . Pulmonary edema 09/29/2017  . Pulmonary edema   . S/P placement of cardiac pacemaker 02/20/2013   02/20/23 in setting of complete heart block   . Sepsis (HCC)   . Severe tricuspid regurgitation by prior echocardiogram 10/16/2017  . Shortness of breath 09/29/2017  . Syncope    2005  . Syncope   . Urinary retention   . Urinary tract infection   . Vertigo   . Vitamin B12 deficiency 10/16/2017    Past Surgical History:  Procedure Laterality Date  . APPENDECTOMY    . CHOLECYSTECTOMY    . KNEE ARTHROSCOPY    . PERMANENT PACEMAKER INSERTION N/A 02/19/2013   Procedure: PERMANENT PACEMAKER INSERTION;  Surgeon: Marinus Maw, MD;  Location: [facility] CATH LAB;  Service: Cardiovascular;  Laterality: N/A;    Allergies as of 12/13/2017      Reactions   Influenza Vaccines Shortness Of Breath, Swelling, Rash   Aleve [naproxen Sodium] Other (See Comments)   Pt was advised by her MD not to take this medication.    Aspirin Other (See Comments)   Reaction:  Causes pts stomach to burn    Terazosin Other (See Comments)   Reaction:  Muscle pain       Medication List        Accurate as of 12/13/17 11:59 PM. Always use your most recent med list.          ALPRAZolam 0.5 MG tablet Commonly known as:  XANAX Take 0.25-0.5  mg by mouth as directed. Give 0.25 mg every AM and 0.5 mg at bedtime.   ELIQUIS 5 MG Tabs tablet Generic drug:  apixaban Take 5 mg by mouth 2 (two) times daily.   esomeprazole 40 MG capsule Commonly known as:  NEXIUM Take 40 mg by mouth daily before breakfast.   feeding supplement (PRO-STAT SUGAR FREE 64) Liqd Take 30 mLs by mouth 2 (two) times daily. To aid in wound healing   fexofenadine 180 MG tablet Commonly known as:  ALLEGRA Take 180 mg by mouth daily as needed for allergies.   fluticasone 50 MCG/ACT nasal spray Commonly known as:  FLONASE Place 1 spray into both nostrils. Daily as needed for rhinitis   furosemide 20 MG tablet Commonly known as:  LASIX Take 20 mg by mouth 2 (two) times daily.   gabapentin 100 MG capsule Commonly known as:  NEURONTIN Take 100  mg by mouth 3 (three) times daily.   magnesium oxide 400 MG tablet Commonly known as:  MAG-OX Take 400 mg by mouth daily.   multivitamin with minerals tablet Take 1 tablet by mouth daily.   PRESERVISION AREDS PO Take by mouth. Take one tablet twice daily as supplement for eyes   nutrition supplement (JUVEN) Pack Take 1 packet by mouth 2 (two) times daily between meals. To aid in wound healing   ENSURE PO Take 1 Bottle by mouth 2 (two) times daily between meals.   ondansetron 4 MG disintegrating tablet Commonly known as:  ZOFRAN-ODT Take 4 mg by mouth. Take one tablet every 8 hours as needed for nausea   polyethylene glycol packet Commonly known as:  MIRALAX / GLYCOLAX Take 17 g by mouth daily as needed.   PROAIR HFA 108 (90 Base) MCG/ACT inhaler Generic drug:  albuterol Inhale into the lungs. Inhale 2 puff every 6 hours as needed for wheezing   SENNA PLUS PO Take 1 tablet by mouth at bedtime as needed (constipation).   simethicone 80 MG chewable tablet Commonly known as:  MYLICON Chew 80 mg by mouth 4 (four) times daily -  before meals and at bedtime.   sodium chloride 1 g tablet Take 1 g by mouth 2 (two) times daily.   SYSTANE OP Place 1 drop into both eyes as needed (dry eyes).   traMADol 50 MG tablet Commonly known as:  ULTRAM Take 50 mg by mouth every 8 (eight) hours. For chronic pain   valsartan 160 MG tablet Commonly known as:  DIOVAN Take 160 mg by mouth daily.   vitamin B-12 250 MCG tablet Commonly known as:  CYANOCOBALAMIN Take 250 mcg by mouth daily. B-12 supplment   vitamin C 500 MG tablet Commonly known as:  ASCORBIC ACID Take 500 mg by mouth 2 (two) times daily. To promote wound healing   ZINC SULFATE PO Take 220 mg by mouth daily. x14 days to aid in wound healing       No orders of the defined types were placed in this encounter.   Immunization History  Administered Date(s) Administered  . Pneumococcal Conjugate-13 12/16/2014  .  Pneumococcal Polysaccharide-23 07/11/2013  . Td 02/06/2009    Social History   Tobacco Use  . Smoking status: Never Smoker  . Smokeless tobacco: Never Used  Substance Use Topics  . Alcohol use: No    Review of Systems  DATA OBTAINED: from patient, nurse GENERAL:  no fevers, fatigue, appetite changes SKIN: No itching, rash HEENT: No complaint RESPIRATORY: No cough, wheezing, SOB CARDIAC: No  chest pain, palpitations,+ lower extremity edema  GI: No abdominal pain, No N/V/D or constipation, No heartburn or reflux  GU: No dysuria, frequency or urgency, or incontinence  MUSCULOSKELETAL: No unrelieved bone/joint pain NEUROLOGIC: No headache, dizziness  PSYCHIATRIC: No overt anxiety or sadness  Vitals:   12/17/17 1441  BP: (!) 100/58  Pulse: 78  Resp: 18  Temp: (!) 97.3 F (36.3 C)   There is no height or weight on file to calculate BMI. Physical Exam  GENERAL APPEARANCE: Alert, conversant, No acute distress  SKIN: No diaphoresis rash HEENT: Unremarkable RESPIRATORY: Breathing is even, unlabored. Lung sounds are clear   CARDIOVASCULAR: Heart RRR no murmurs, rubs or gallops. + peripheral edema  GASTROINTESTINAL: Abdomen is soft, non-tender, not distended w/ normal bowel sounds.  GENITOURINARY: Bladder non tender, not distended  MUSCULOSKELETAL: No abnormal joints or musculature NEUROLOGIC: Cranial nerves 2-12 grossly intact. Moves all extremities PSYCHIATRIC: Mood and affect appropriate to situation, no behavioral issues  Patient Active Problem List   Diagnosis Date Noted  . Macular degeneration 12/15/2017  . Allergic rhinitis 12/15/2017  . Paraplegia (HCC) 12/12/2017  . UTI (urinary tract infection) 11/12/2017  . Dehydration 11/12/2017  . Acute metabolic encephalopathy 11/12/2017  . Polyneuropathy 11/12/2017  . Acute on chronic congestive heart failure (HCC) 10/16/2017  . Pulmonary hypertension, primary (HCC) 10/16/2017  . Severe tricuspid regurgitation by prior  echocardiogram 10/16/2017  . Acute urinary retention 10/16/2017  . Vitamin B12 deficiency 10/16/2017  . History of bradycardia 07/01/2017  . Abdominal pain 04/30/2017  . Constipation 04/30/2017  . Klebsiella cystitis 04/30/2017  . Hypokalemia 04/30/2017  . Chronic back pain   . Osteoporosis   . Allergy history unknown   . Pacemaker   . Hearing difficulty   . Chest pain 04/07/2016  . Atrial flutter (HCC) 04/07/2016  . Chronic anticoagulation 04/07/2016  . Hypertension 03/01/2013  . Hypercalcemia 03/01/2013  . S/P placement of cardiac pacemaker 02/20/2013  . Bradycardia 02/18/2013  . Hyponatremia 02/18/2013  . Chest tightness or pressure 02/18/2013  . Heart block AV third degree (HCC) 02/18/2013  . Depression   . Vertigo   . GERD (gastroesophageal reflux disease)   . Arrhythmia   . Anxiety   . Complete heart block (HCC) 02/13/2013    CMP     Component Value Date/Time   NA 132 (A) 10/15/2017   K 4.1 10/15/2017   CL 90 (L) 12/02/2016 0511   CO2 25 12/02/2016 0232   GLUCOSE 78 12/02/2016 0511   BUN 28 (A) 10/15/2017   CREATININE 0.9 10/15/2017   CREATININE 0.50 12/02/2016 0511   CREATININE 0.89 03/01/2013 1147   CALCIUM 8.7 (L) 12/02/2016 0232   PROT 6.2 (L) 12/02/2016 0232   ALBUMIN 4.2 12/02/2016 0232   AST 19 10/10/2017   ALT 20 10/10/2017   ALKPHOS 93 10/06/2017   BILITOT 1.1 12/02/2016 0232   GFRNONAA >60 12/02/2016 0232   GFRAA >60 12/02/2016 0232   Recent Labs    06/13/17 06/20/17 10/10/17 10/15/17  NA 134* 139 131* 132*  K 4.2  --  4.1 4.1  BUN 14  --  24* 28*  CREATININE 0.9  --  0.8 0.9   Recent Labs    04/25/17 06/08/17 10/06/17 10/10/17  AST 25 21  --  19  ALT 19 20  --  20  ALKPHOS 95 77 93  --    Recent Labs    06/08/17 10/10/17 10/15/17  WBC 7.4 7.8 9.9  HGB 12.4 11.4* 10.7*  HCT  36 34* 33*  PLT 155 297 295   Recent Labs    02/03/17  CHOL 162  LDLCALC 86  TRIG 52   No results found for: MICROALBUR TSH-0.863 No results found  for: HGBA1C Lab Results  Component Value Date   CHOL 162 02/03/2017   HDL 66 02/03/2017   LDLCALC 86 02/03/2017   TRIG 52 02/03/2017   CHOLHDL 2.1 02/18/2013    Significant Diagnostic Results in last 30 days:  No results found.  Assessment and Plan  Weight gain with edema-patient is on 20 mg of Lasix twice a day we'll increase Lasix to 40 mg twice a day and monitor weights     Merrilee SeashoreAnne Yu Cragun, MD

## 2017-12-22 ENCOUNTER — Telehealth: Payer: Self-pay | Admitting: *Deleted

## 2017-12-22 NOTE — Telephone Encounter (Signed)
Tonya Maddox with [facility] called and stated that Dr. Lyn HollingsheadAlexander is not answering her phone and they have a patient that is being sent out to the hospital and needed verbal.  Stated that the patient is not responding and has been very Lethargic. Patient's family is aware and consented for patient to be sent out to the Hospital.  Verbal Given.

## 2017-12-22 NOTE — Telephone Encounter (Signed)
Noted.  Sounds appropriate and I see no documentation to suggest plans to keep her in the facility in case of emergency.

## 2017-12-23 ENCOUNTER — Encounter (HOSPITAL_BASED_OUTPATIENT_CLINIC_OR_DEPARTMENT_OTHER): Payer: Medicare Other

## 2017-12-27 ENCOUNTER — Inpatient Hospital Stay
Admission: AD | Admit: 2017-12-27 | Discharge: 2018-01-13 | Disposition: E | Payer: Self-pay | Source: Ambulatory Visit | Attending: Internal Medicine | Admitting: Internal Medicine

## 2017-12-27 DIAGNOSIS — J9600 Acute respiratory failure, unspecified whether with hypoxia or hypercapnia: Secondary | ICD-10-CM

## 2017-12-27 LAB — COMPREHENSIVE METABOLIC PANEL
ALK PHOS: 90 U/L (ref 38–126)
ALT: 40 U/L (ref 14–54)
ANION GAP: 11 (ref 5–15)
AST: 33 U/L (ref 15–41)
Albumin: 1.9 g/dL — ABNORMAL LOW (ref 3.5–5.0)
BILIRUBIN TOTAL: 0.8 mg/dL (ref 0.3–1.2)
BUN: 13 mg/dL (ref 6–20)
CALCIUM: 8.4 mg/dL — AB (ref 8.9–10.3)
CO2: 23 mmol/L (ref 22–32)
Chloride: 101 mmol/L (ref 101–111)
Creatinine, Ser: 0.5 mg/dL (ref 0.44–1.00)
Glucose, Bld: 123 mg/dL — ABNORMAL HIGH (ref 65–99)
Potassium: 4.4 mmol/L (ref 3.5–5.1)
Sodium: 135 mmol/L (ref 135–145)
TOTAL PROTEIN: 4.8 g/dL — AB (ref 6.5–8.1)

## 2017-12-27 MED ORDER — VITAMIN C 500 MG PO TABS
500.00 | ORAL_TABLET | ORAL | Status: DC
Start: 2017-12-27 — End: 2017-12-27

## 2017-12-27 MED ORDER — COLLAGENASE 250 UNIT/GM EX OINT
TOPICAL_OINTMENT | CUTANEOUS | Status: DC
Start: 2017-12-28 — End: 2017-12-27

## 2017-12-27 MED ORDER — PANTOPRAZOLE 40 MG/20 ML SUSPENSION
40.00 | PACK | ORAL | Status: DC
Start: 2017-12-28 — End: 2017-12-27

## 2017-12-27 MED ORDER — POLYVINYL ALCOHOL 1.4 % OP SOLN
1.00 | OPHTHALMIC | Status: DC
Start: ? — End: 2017-12-27

## 2017-12-27 MED ORDER — TRAMADOL HCL 50 MG PO TABS
50.00 | ORAL_TABLET | ORAL | Status: DC
Start: ? — End: 2017-12-27

## 2017-12-27 MED ORDER — POLYETHYLENE GLYCOL 3350 17 G PO PACK
17.00 g | PACK | ORAL | Status: DC
Start: ? — End: 2017-12-27

## 2017-12-27 MED ORDER — BISACODYL 10 MG RE SUPP
10.00 | RECTAL | Status: DC
Start: ? — End: 2017-12-27

## 2017-12-27 MED ORDER — GUAIFENESIN-DM 100-10 MG/5ML PO SYRP
5.00 | ORAL_SOLUTION | ORAL | Status: DC
Start: ? — End: 2017-12-27

## 2017-12-27 MED ORDER — MULTI-VITAMINS PO TABS
1.00 | ORAL_TABLET | ORAL | Status: DC
Start: 2017-12-28 — End: 2017-12-27

## 2017-12-27 MED ORDER — SIMETHICONE 80 MG PO CHEW
80.00 | CHEWABLE_TABLET | ORAL | Status: DC
Start: 2017-12-27 — End: 2017-12-27

## 2017-12-27 MED ORDER — LORATADINE 10 MG PO TABS
10.00 | ORAL_TABLET | ORAL | Status: DC
Start: 2017-12-28 — End: 2017-12-27

## 2017-12-27 MED ORDER — MAGNESIUM OXIDE 400 MG PO TABS
400.00 | ORAL_TABLET | ORAL | Status: DC
Start: 2017-12-28 — End: 2017-12-27

## 2017-12-27 MED ORDER — ALBUTEROL SULFATE 1.25 MG/3ML IN NEBU
1.25 | INHALATION_SOLUTION | RESPIRATORY_TRACT | Status: DC
Start: ? — End: 2017-12-27

## 2017-12-27 MED ORDER — METRONIDAZOLE 500 MG PO TABS
500.00 | ORAL_TABLET | ORAL | Status: DC
Start: 2017-12-27 — End: 2017-12-27

## 2017-12-27 MED ORDER — SODIUM CHLORIDE 0.9 % IJ SOLN
5.00 | INTRAMUSCULAR | Status: DC
Start: 2017-12-27 — End: 2017-12-27

## 2017-12-27 MED ORDER — GUAIFENESIN ER 600 MG PO TB12
600.00 | ORAL_TABLET | ORAL | Status: DC
Start: 2017-12-27 — End: 2017-12-27

## 2017-12-27 MED ORDER — ACETAMINOPHEN 325 MG PO TABS
650.00 | ORAL_TABLET | ORAL | Status: DC
Start: ? — End: 2017-12-27

## 2017-12-27 MED ORDER — SENNOSIDES-DOCUSATE SODIUM 8.6-50 MG PO TABS
1.00 | ORAL_TABLET | ORAL | Status: DC
Start: 2017-12-27 — End: 2017-12-27

## 2017-12-27 MED ORDER — FLUTICASONE PROPIONATE 50 MCG/ACT NA SUSP
1.00 | NASAL | Status: DC
Start: ? — End: 2017-12-27

## 2017-12-27 MED ORDER — SODIUM CHLORIDE 1 G PO TABS
1.00 g | ORAL_TABLET | ORAL | Status: DC
Start: 2017-12-27 — End: 2017-12-27

## 2017-12-27 MED ORDER — GENERIC EXTERNAL MEDICATION
2000.00 | Status: DC
Start: 2017-12-27 — End: 2017-12-27

## 2017-12-27 MED ORDER — LIDOCAINE HCL 2 % EX GEL
CUTANEOUS | Status: DC
Start: ? — End: 2017-12-27

## 2017-12-27 MED ORDER — VITAMIN B-12 500 MCG PO TABS
250.00 | ORAL_TABLET | ORAL | Status: DC
Start: 2017-12-28 — End: 2017-12-27

## 2017-12-27 MED ORDER — GABAPENTIN 100 MG PO CAPS
100.00 | ORAL_CAPSULE | ORAL | Status: DC
Start: 2017-12-27 — End: 2017-12-27

## 2017-12-27 MED ORDER — ALPRAZOLAM 0.25 MG PO TABS
0.25 | ORAL_TABLET | ORAL | Status: DC
Start: 2017-12-28 — End: 2017-12-27

## 2017-12-27 MED ORDER — SODIUM CHLORIDE 0.9 % IJ SOLN
5.00 | INTRAMUSCULAR | Status: DC
Start: ? — End: 2017-12-27

## 2017-12-27 MED ORDER — GENERIC EXTERNAL MEDICATION
Status: DC
Start: 2017-12-27 — End: 2017-12-27

## 2017-12-27 MED ORDER — ONDANSETRON HCL 4 MG/2ML IJ SOLN
4.00 | INTRAMUSCULAR | Status: DC
Start: ? — End: 2017-12-27

## 2017-12-27 MED ORDER — ALPRAZOLAM 0.5 MG PO TABS
0.50 | ORAL_TABLET | ORAL | Status: DC
Start: ? — End: 2017-12-27

## 2017-12-27 MED ORDER — APIXABAN 5 MG PO TABS
5.00 | ORAL_TABLET | ORAL | Status: DC
Start: 2017-12-27 — End: 2017-12-27

## 2017-12-27 MED ORDER — SODIUM CHLORIDE 0.9 % IV SOLN
INTRAVENOUS | Status: DC
Start: ? — End: 2017-12-27

## 2017-12-27 MED ORDER — HYDRALAZINE HCL 20 MG/ML IJ SOLN
10.00 | INTRAMUSCULAR | Status: DC
Start: ? — End: 2017-12-27

## 2017-12-28 LAB — COMPREHENSIVE METABOLIC PANEL
ALT: 39 U/L (ref 14–54)
AST: 31 U/L (ref 15–41)
Albumin: 1.9 g/dL — ABNORMAL LOW (ref 3.5–5.0)
Alkaline Phosphatase: 91 U/L (ref 38–126)
Anion gap: 10 (ref 5–15)
BILIRUBIN TOTAL: 0.8 mg/dL (ref 0.3–1.2)
BUN: 13 mg/dL (ref 6–20)
CO2: 23 mmol/L (ref 22–32)
CREATININE: 0.52 mg/dL (ref 0.44–1.00)
Calcium: 8.5 mg/dL — ABNORMAL LOW (ref 8.9–10.3)
Chloride: 101 mmol/L (ref 101–111)
GFR calc Af Amer: >60 mL/min (ref >60)
Glucose, Bld: 115 mg/dL — ABNORMAL HIGH (ref 65–99)
POTASSIUM: 4.1 mmol/L (ref 3.5–5.1)
Sodium: 134 mmol/L — ABNORMAL LOW (ref 135–145)
TOTAL PROTEIN: 4.8 g/dL — AB (ref 6.5–8.1)

## 2017-12-28 LAB — CBC WITH DIFFERENTIAL/PLATELET
BASOS ABS: 0.1 10*3/uL (ref 0.0–0.1)
Basophils Relative: 1 %
Eosinophils Absolute: 0.3 10*3/uL (ref 0.0–0.7)
Eosinophils Relative: 2 %
HCT: 31.2 % — ABNORMAL LOW (ref 36.0–46.0)
HEMOGLOBIN: 10 g/dL — AB (ref 12.0–15.0)
LYMPHS ABS: 1.2 10*3/uL (ref 0.7–4.0)
Lymphocytes Relative: 9 %
MCH: 29.3 pg (ref 26.0–34.0)
MCHC: 32.1 g/dL (ref 30.0–36.0)
MCV: 91.5 fL (ref 78.0–100.0)
MONO ABS: 0.6 10*3/uL (ref 0.1–1.0)
MONOS PCT: 5 %
NEUTROS ABS: 10.6 10*3/uL — AB (ref 1.7–7.7)
Neutrophils Relative %: 83 %
Platelets: 427 10*3/uL — ABNORMAL HIGH (ref 150–400)
RBC: 3.41 MIL/uL — ABNORMAL LOW (ref 3.87–5.11)
RDW: 18.6 % — ABNORMAL HIGH (ref 11.5–15.5)
WBC: 12.8 10*3/uL — ABNORMAL HIGH (ref 4.0–10.5)

## 2017-12-28 LAB — PROTIME-INR
INR: 1.28
PROTHROMBIN TIME: 15.9 s — AB (ref 11.4–15.2)

## 2017-12-30 ENCOUNTER — Other Ambulatory Visit: Payer: Medicare Other

## 2017-12-30 ENCOUNTER — Inpatient Hospital Stay: Admission: RE | Admit: 2017-12-30 | Payer: Medicare Other | Source: Ambulatory Visit

## 2018-01-02 LAB — BASIC METABOLIC PANEL
ANION GAP: 13 (ref 5–15)
BUN: 7 mg/dL (ref 6–20)
CALCIUM: 8 mg/dL — AB (ref 8.9–10.3)
CHLORIDE: 93 mmol/L — AB (ref 101–111)
CO2: 29 mmol/L (ref 22–32)
Creatinine, Ser: 0.5 mg/dL (ref 0.44–1.00)
GFR calc non Af Amer: >60 mL/min (ref >60)
Glucose, Bld: 114 mg/dL — ABNORMAL HIGH (ref 65–99)
POTASSIUM: 3 mmol/L — AB (ref 3.5–5.1)
Sodium: 135 mmol/L (ref 135–145)

## 2018-01-02 LAB — BLOOD GAS, ARTERIAL
Acid-Base Excess: 8.4 mmol/L — ABNORMAL HIGH (ref 0.0–2.0)
Acid-Base Excess: 10 mmol/L — ABNORMAL HIGH (ref 0.0–2.0)
BICARBONATE: 33.9 mmol/L — AB (ref 20.0–28.0)
Bicarbonate: 34.5 mmol/L — ABNORMAL HIGH (ref 20.0–28.0)
Delivery systems: POSITIVE
Delivery systems: POSITIVE
EXPIRATORY PAP: 8
EXPIRATORY PAP: 8
FIO2: 100
FIO2: 1
INSPIRATORY PAP: 18
Inspiratory PAP: 18
O2 SAT: 81.8 %
O2 SAT: 96.4 %
PATIENT TEMPERATURE: 98.6
PO2 ART: 53.3 mmHg — AB (ref 83.0–108.0)
PO2 ART: 83.6 mmHg (ref 83.0–108.0)
Patient temperature: 98.6
pCO2 arterial: 61.9 mmHg — ABNORMAL HIGH (ref 32.0–48.0)
pCO2 arterial: 51.3 mmHg — ABNORMAL HIGH (ref 32.0–48.0)
pH, Arterial: 7.358 (ref 7.350–7.450)
pH, Arterial: 7.443 (ref 7.350–7.450)

## 2018-01-02 LAB — CBC
HEMATOCRIT: 35.7 % — AB (ref 36.0–46.0)
Hemoglobin: 11.4 g/dL — ABNORMAL LOW (ref 12.0–15.0)
MCH: 28.4 pg (ref 26.0–34.0)
MCHC: 31.9 g/dL (ref 30.0–36.0)
MCV: 88.8 fL (ref 78.0–100.0)
Platelets: 367 10*3/uL (ref 150–400)
RBC: 4.02 MIL/uL (ref 3.87–5.11)
RDW: 19 % — ABNORMAL HIGH (ref 11.5–15.5)
WBC: 20 10*3/uL — AB (ref 4.0–10.5)

## 2018-01-03 ENCOUNTER — Other Ambulatory Visit (HOSPITAL_COMMUNITY): Payer: Self-pay

## 2018-01-03 LAB — CBC
HCT: 36.5 % (ref 36.0–46.0)
Hemoglobin: 11.4 g/dL — ABNORMAL LOW (ref 12.0–15.0)
MCH: 28.4 pg (ref 26.0–34.0)
MCHC: 31.2 g/dL (ref 30.0–36.0)
MCV: 91 fL (ref 78.0–100.0)
PLATELETS: 374 10*3/uL (ref 150–400)
RBC: 4.01 MIL/uL (ref 3.87–5.11)
RDW: 19.6 % — AB (ref 11.5–15.5)
WBC: 22 10*3/uL — ABNORMAL HIGH (ref 4.0–10.5)

## 2018-01-03 LAB — BASIC METABOLIC PANEL
Anion gap: 15 (ref 5–15)
BUN: 11 mg/dL (ref 6–20)
CALCIUM: 8.1 mg/dL — AB (ref 8.9–10.3)
CHLORIDE: 94 mmol/L — AB (ref 101–111)
CO2: 25 mmol/L (ref 22–32)
CREATININE: 0.45 mg/dL (ref 0.44–1.00)
GFR calc Af Amer: >60 mL/min (ref >60)
Glucose, Bld: 93 mg/dL (ref 65–99)
Potassium: 4.5 mmol/L (ref 3.5–5.1)
SODIUM: 134 mmol/L — AB (ref 135–145)

## 2018-01-03 LAB — URINALYSIS, ROUTINE W REFLEX MICROSCOPIC
BILIRUBIN URINE: NEGATIVE
Glucose, UA: NEGATIVE mg/dL
HGB URINE DIPSTICK: NEGATIVE
KETONES UR: NEGATIVE mg/dL
Nitrite: NEGATIVE
Protein, ur: 100 mg/dL — AB
Specific Gravity, Urine: 1.03 (ref 1.005–1.030)
Squamous Epithelial / LPF: NONE SEEN
pH: 5 (ref 5.0–8.0)

## 2018-01-03 LAB — PHOSPHORUS: Phosphorus: 3.3 mg/dL (ref 2.5–4.6)

## 2018-01-03 LAB — MAGNESIUM: MAGNESIUM: 1.4 mg/dL — AB (ref 1.7–2.4)

## 2018-01-04 LAB — URINE CULTURE

## 2018-01-07 LAB — CULTURE, BLOOD (ROUTINE X 2)
CULTURE: NO GROWTH
CULTURE: NO GROWTH
SPECIAL REQUESTS: ADEQUATE
Special Requests: ADEQUATE

## 2018-01-13 DEATH — deceased

## 2018-03-14 ENCOUNTER — Ambulatory Visit: Payer: Medicare Other | Admitting: Neurology

## 2018-11-20 IMAGING — CR DG HIP (WITH OR WITHOUT PELVIS) 3-4V BILAT
5 series · 5 of 5 positions shown · non-contrast
Comparison: None.

CLINICAL DATA: Bilateral hip and lower back pain after fall today.

EXAM:
DG HIP (WITH OR WITHOUT PELVIS) 3-4V BILAT

[t pelvis ap]
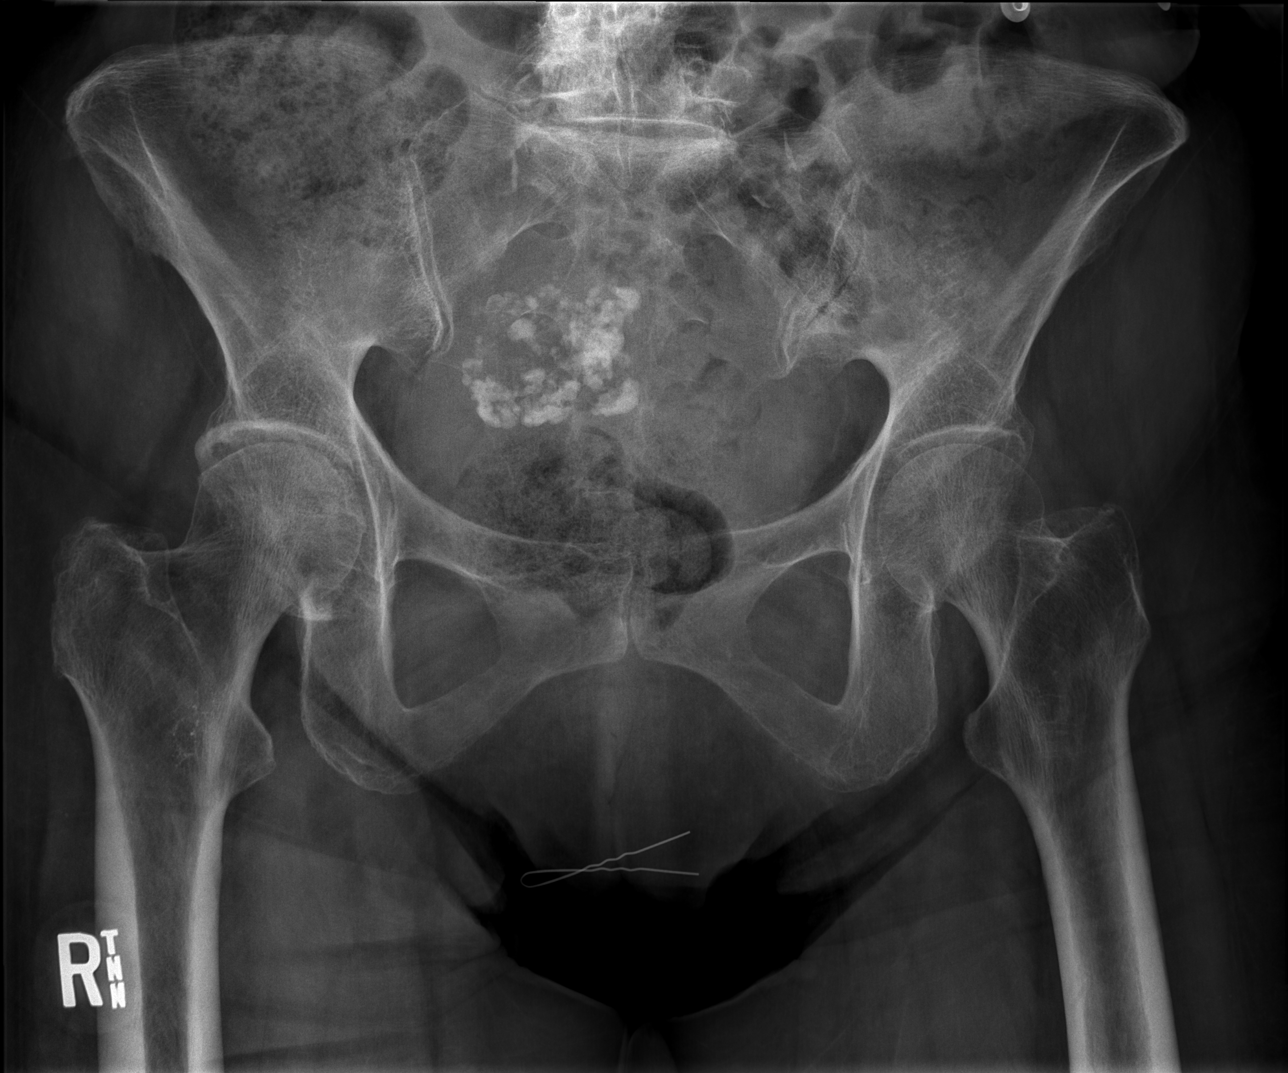

[t hip ap left]
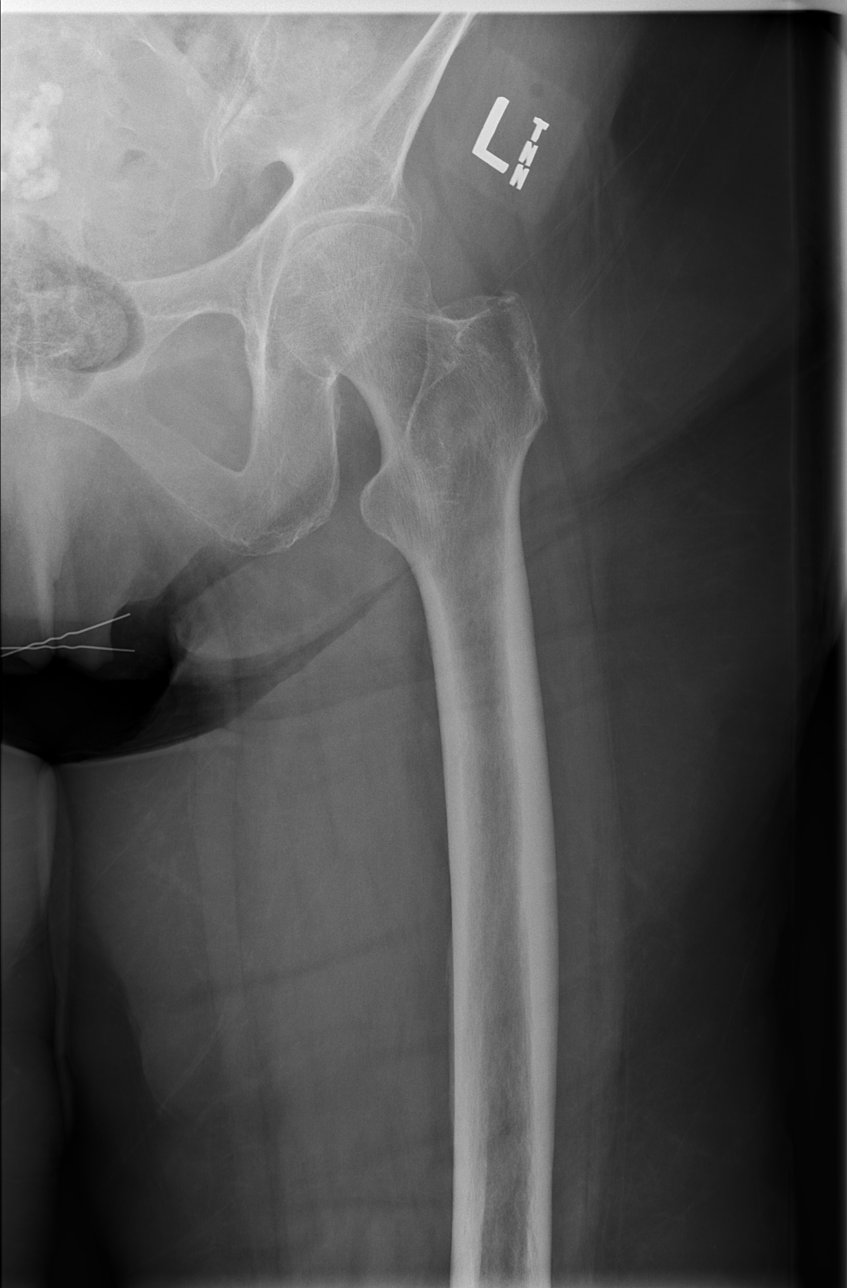

[t hip ap right]
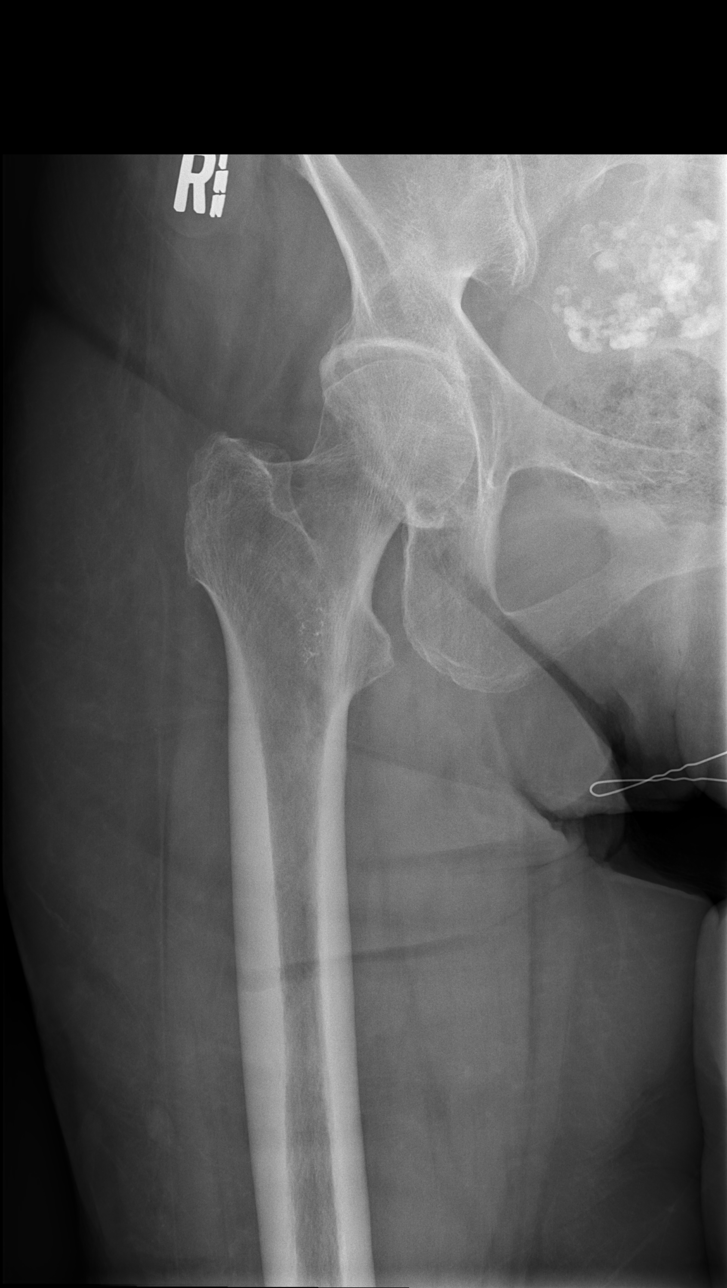

[t hip frog leg right]
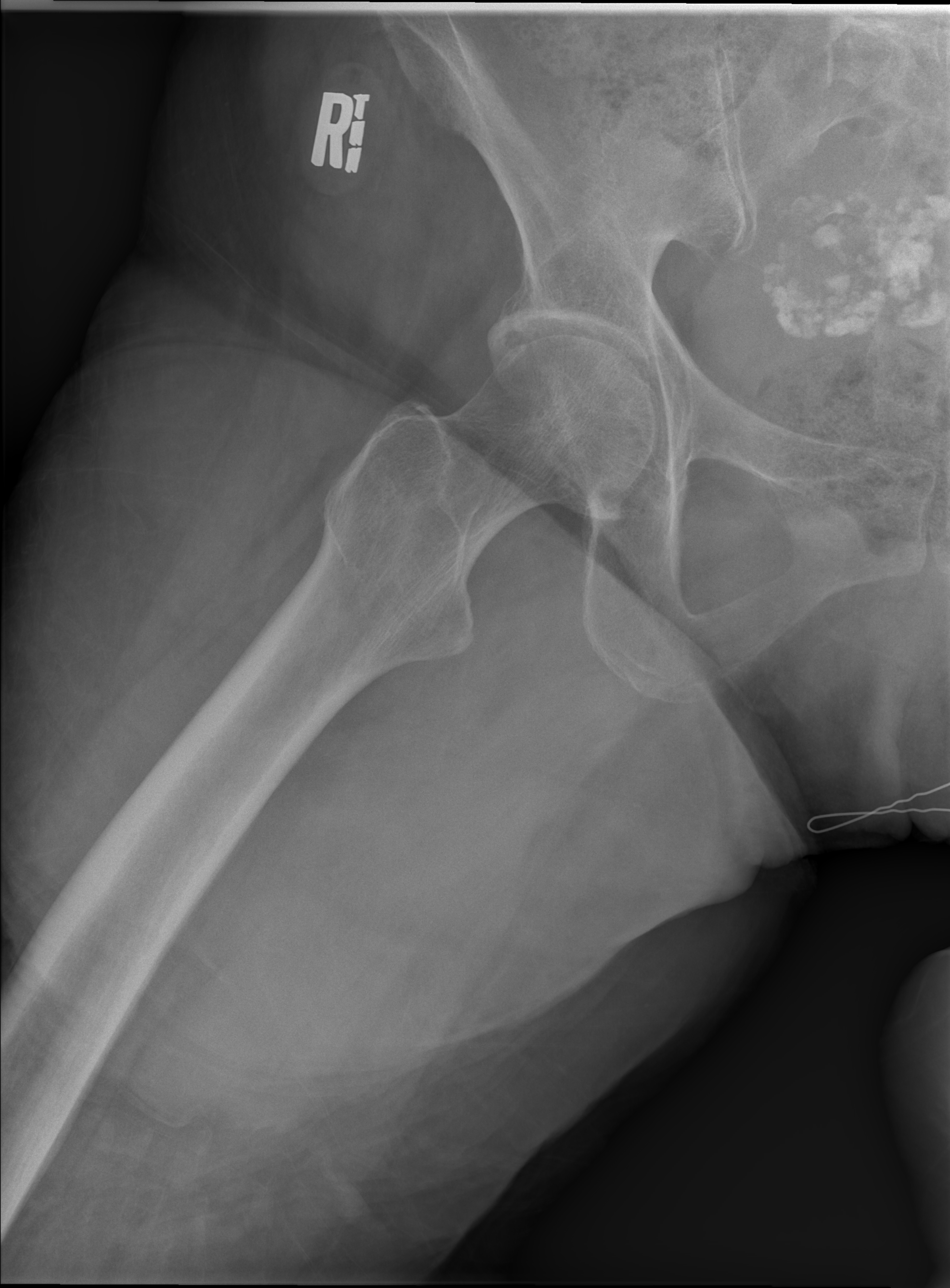

[t hip frog leg left]
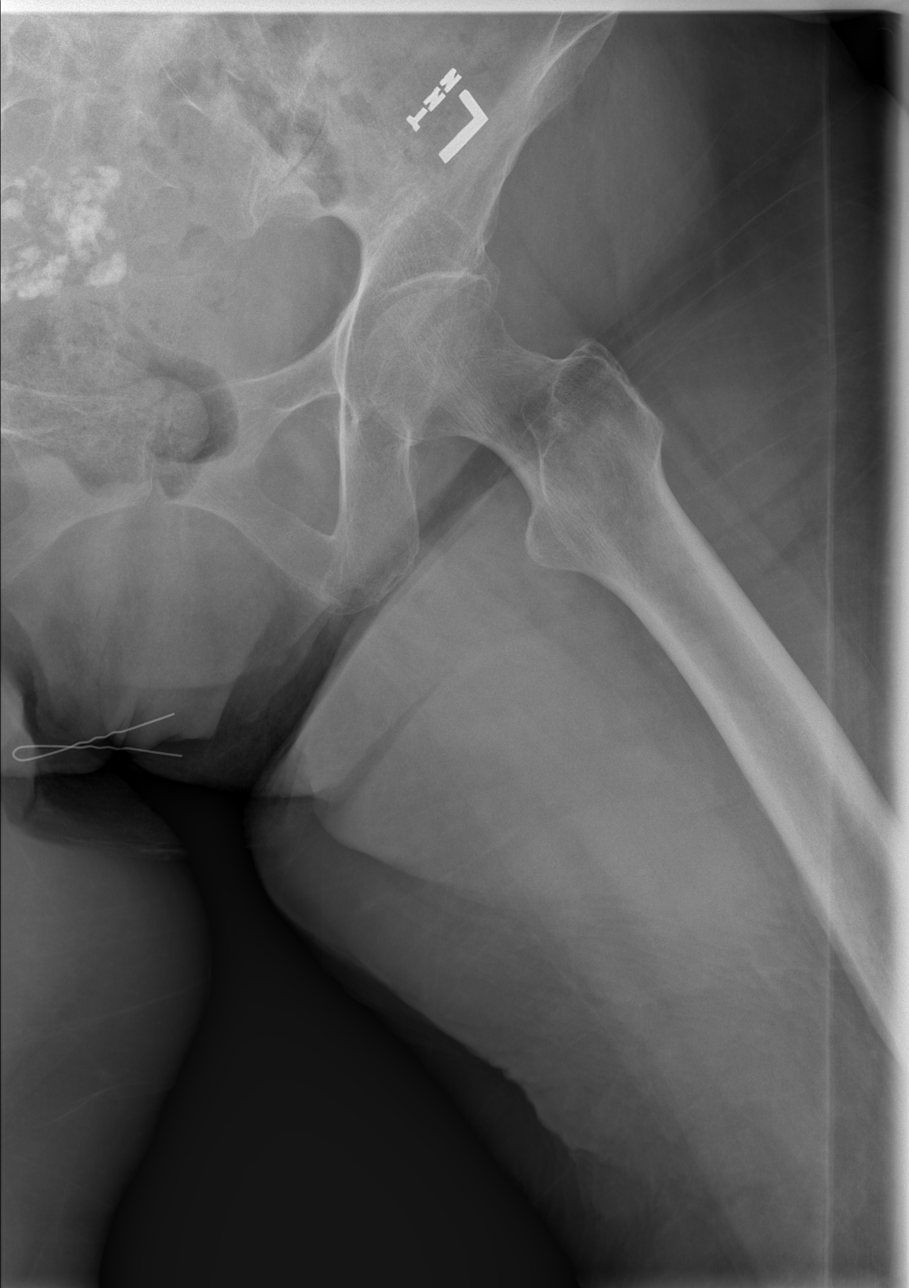

[5 of 5 positions shown; findings below may reference images not displayed]

FINDINGS: There is no evidence of hip fracture or dislocation. There is no
evidence of arthropathy or other focal bone abnormality.
IMPRESSION: Normal bilateral hips.
